# Patient Record
Sex: Male | Born: 1950 | Race: Black or African American | Hispanic: No | Smoking: Never smoker
Health system: Southern US, Community
[De-identification: ages and names within clinical notes are randomized; demographics above are authoritative.]

## PROBLEM LIST (undated history)

## (undated) DIAGNOSIS — B1920 Unspecified viral hepatitis C without hepatic coma: Secondary | ICD-10-CM

## (undated) DIAGNOSIS — M109 Gout, unspecified: Secondary | ICD-10-CM

## (undated) DIAGNOSIS — C61 Malignant neoplasm of prostate: Secondary | ICD-10-CM

## (undated) DIAGNOSIS — N183 Chronic kidney disease, stage 3 unspecified: Secondary | ICD-10-CM

## (undated) DIAGNOSIS — Z8719 Personal history of other diseases of the digestive system: Secondary | ICD-10-CM

## (undated) DIAGNOSIS — R351 Nocturia: Secondary | ICD-10-CM

## (undated) DIAGNOSIS — M1A09X1 Idiopathic chronic gout, multiple sites, with tophus (tophi): Secondary | ICD-10-CM

## (undated) DIAGNOSIS — M5137 Other intervertebral disc degeneration, lumbosacral region: Secondary | ICD-10-CM

## (undated) DIAGNOSIS — I10 Essential (primary) hypertension: Secondary | ICD-10-CM

## (undated) DIAGNOSIS — M1990 Unspecified osteoarthritis, unspecified site: Secondary | ICD-10-CM

## (undated) DIAGNOSIS — M48061 Spinal stenosis, lumbar region without neurogenic claudication: Secondary | ICD-10-CM

## (undated) DIAGNOSIS — N281 Cyst of kidney, acquired: Secondary | ICD-10-CM

## (undated) DIAGNOSIS — R399 Unspecified symptoms and signs involving the genitourinary system: Secondary | ICD-10-CM

## (undated) DIAGNOSIS — E785 Hyperlipidemia, unspecified: Secondary | ICD-10-CM

## (undated) DIAGNOSIS — M51379 Other intervertebral disc degeneration, lumbosacral region without mention of lumbar back pain or lower extremity pain: Secondary | ICD-10-CM

## (undated) HISTORY — PX: COLONOSCOPY WITH ESOPHAGOGASTRODUODENOSCOPY (EGD): SHX5779

## (undated) HISTORY — PX: HERNIA REPAIR: SHX51

## (undated) HISTORY — PX: PROSTATE BIOPSY: SHX241

---

## 1999-03-23 ENCOUNTER — Encounter: Payer: Self-pay | Admitting: Emergency Medicine

## 1999-03-23 ENCOUNTER — Emergency Department (HOSPITAL_COMMUNITY): Admission: EM | Admit: 1999-03-23 | Discharge: 1999-03-23 | Payer: Self-pay | Admitting: *Deleted

## 2001-03-09 ENCOUNTER — Emergency Department (HOSPITAL_COMMUNITY): Admission: EM | Admit: 2001-03-09 | Discharge: 2001-03-09 | Payer: Self-pay | Admitting: Emergency Medicine

## 2001-10-16 ENCOUNTER — Encounter (HOSPITAL_COMMUNITY): Admission: RE | Admit: 2001-10-16 | Discharge: 2002-01-14 | Payer: Self-pay | Admitting: *Deleted

## 2002-01-21 ENCOUNTER — Encounter (HOSPITAL_COMMUNITY): Admission: RE | Admit: 2002-01-21 | Discharge: 2002-04-21 | Payer: Self-pay | Admitting: *Deleted

## 2002-01-24 ENCOUNTER — Emergency Department (HOSPITAL_COMMUNITY): Admission: EM | Admit: 2002-01-24 | Discharge: 2002-01-24 | Payer: Self-pay

## 2003-02-22 ENCOUNTER — Emergency Department (HOSPITAL_COMMUNITY): Admission: EM | Admit: 2003-02-22 | Discharge: 2003-02-22 | Payer: Self-pay | Admitting: Emergency Medicine

## 2004-01-16 ENCOUNTER — Emergency Department (HOSPITAL_COMMUNITY): Admission: EM | Admit: 2004-01-16 | Discharge: 2004-01-16 | Payer: Self-pay | Admitting: Emergency Medicine

## 2004-06-11 ENCOUNTER — Emergency Department (HOSPITAL_COMMUNITY): Admission: EM | Admit: 2004-06-11 | Discharge: 2004-06-12 | Payer: Self-pay | Admitting: Emergency Medicine

## 2006-05-21 ENCOUNTER — Emergency Department (HOSPITAL_COMMUNITY): Admission: EM | Admit: 2006-05-21 | Discharge: 2006-05-21 | Payer: Self-pay | Admitting: Emergency Medicine

## 2006-07-19 ENCOUNTER — Emergency Department (HOSPITAL_COMMUNITY): Admission: EM | Admit: 2006-07-19 | Discharge: 2006-07-19 | Payer: Self-pay | Admitting: Emergency Medicine

## 2006-07-26 ENCOUNTER — Ambulatory Visit: Payer: Self-pay | Admitting: Family Medicine

## 2006-08-09 ENCOUNTER — Ambulatory Visit: Payer: Self-pay | Admitting: Family Medicine

## 2006-09-03 ENCOUNTER — Emergency Department (HOSPITAL_COMMUNITY): Admission: EM | Admit: 2006-09-03 | Discharge: 2006-09-03 | Payer: Self-pay | Admitting: Emergency Medicine

## 2006-09-06 ENCOUNTER — Ambulatory Visit: Payer: Self-pay | Admitting: Family Medicine

## 2006-09-11 ENCOUNTER — Ambulatory Visit: Payer: Self-pay | Admitting: Family Medicine

## 2006-09-29 ENCOUNTER — Ambulatory Visit: Payer: Self-pay | Admitting: Internal Medicine

## 2006-10-03 ENCOUNTER — Ambulatory Visit: Payer: Self-pay | Admitting: Family Medicine

## 2006-10-13 ENCOUNTER — Ambulatory Visit: Payer: Self-pay | Admitting: Internal Medicine

## 2006-11-03 ENCOUNTER — Ambulatory Visit: Payer: Self-pay | Admitting: Family Medicine

## 2007-06-08 ENCOUNTER — Emergency Department (HOSPITAL_COMMUNITY): Admission: EM | Admit: 2007-06-08 | Discharge: 2007-06-08 | Payer: Self-pay | Admitting: Emergency Medicine

## 2007-06-25 ENCOUNTER — Ambulatory Visit: Payer: Self-pay | Admitting: Family Medicine

## 2007-11-30 ENCOUNTER — Emergency Department (HOSPITAL_COMMUNITY): Admission: EM | Admit: 2007-11-30 | Discharge: 2007-11-30 | Payer: Self-pay | Admitting: Family Medicine

## 2008-05-10 ENCOUNTER — Emergency Department (HOSPITAL_COMMUNITY): Admission: EM | Admit: 2008-05-10 | Discharge: 2008-05-10 | Payer: Self-pay | Admitting: Emergency Medicine

## 2008-05-15 ENCOUNTER — Ambulatory Visit: Payer: Self-pay | Admitting: Family Medicine

## 2008-06-16 ENCOUNTER — Ambulatory Visit: Payer: Self-pay | Admitting: Family Medicine

## 2008-09-18 ENCOUNTER — Emergency Department (HOSPITAL_COMMUNITY): Admission: EM | Admit: 2008-09-18 | Discharge: 2008-09-18 | Payer: Self-pay | Admitting: Emergency Medicine

## 2009-04-07 ENCOUNTER — Emergency Department (HOSPITAL_COMMUNITY): Admission: EM | Admit: 2009-04-07 | Discharge: 2009-04-07 | Payer: Self-pay | Admitting: Family Medicine

## 2009-05-26 ENCOUNTER — Emergency Department (HOSPITAL_COMMUNITY): Admission: EM | Admit: 2009-05-26 | Discharge: 2009-05-26 | Payer: Self-pay | Admitting: Emergency Medicine

## 2009-09-20 ENCOUNTER — Emergency Department (HOSPITAL_COMMUNITY): Admission: EM | Admit: 2009-09-20 | Discharge: 2009-09-20 | Payer: Self-pay | Admitting: Emergency Medicine

## 2010-03-20 ENCOUNTER — Encounter: Payer: Self-pay | Admitting: *Deleted

## 2010-05-24 ENCOUNTER — Emergency Department (HOSPITAL_COMMUNITY)
Admission: EM | Admit: 2010-05-24 | Discharge: 2010-05-24 | Disposition: A | Discharge status: Home / Self Care | Payer: No Typology Code available for payment source | Attending: Emergency Medicine | Admitting: Emergency Medicine

## 2010-05-24 DIAGNOSIS — R21 Rash and other nonspecific skin eruption: Secondary | ICD-10-CM | POA: Insufficient documentation

## 2010-05-24 DIAGNOSIS — M109 Gout, unspecified: Secondary | ICD-10-CM | POA: Insufficient documentation

## 2010-05-24 DIAGNOSIS — M79609 Pain in unspecified limb: Secondary | ICD-10-CM | POA: Insufficient documentation

## 2010-05-24 DIAGNOSIS — L02619 Cutaneous abscess of unspecified foot: Secondary | ICD-10-CM | POA: Insufficient documentation

## 2010-05-24 DIAGNOSIS — I10 Essential (primary) hypertension: Secondary | ICD-10-CM | POA: Insufficient documentation

## 2010-05-24 LAB — CBC
HCT: 39.2 % (ref 39.0–52.0)
MCV: 94.8 fL (ref 78.0–100.0)
Platelets: 152 10*3/uL (ref 150–400)
Platelets: 155 10*3/uL (ref 150–400)
RDW: 12.9 % (ref 11.5–15.5)
WBC: 11.2 10*3/uL — ABNORMAL HIGH (ref 4.0–10.5)
WBC: 10.6 10*3/uL — ABNORMAL HIGH (ref 4.0–10.5)

## 2010-05-24 LAB — DIFFERENTIAL
Basophils Absolute: 0 10*3/uL (ref 0.0–0.1)
Basophils Relative: 0 % (ref 0–1)
Eosinophils Absolute: 0.1 10*3/uL (ref 0.0–0.7)
Eosinophils Relative: 1 % (ref 0–5)

## 2010-05-24 LAB — GRAM STAIN

## 2010-05-24 LAB — ANAEROBIC CULTURE

## 2010-05-24 LAB — BASIC METABOLIC PANEL
BUN: 15 mg/dL (ref 6–23)
GFR calc Af Amer: >60 mL/min (ref >60)
GFR calc non Af Amer: 55 mL/min — ABNORMAL LOW (ref >60)
Potassium: 4.5 mEq/L (ref 3.5–5.1)
Sodium: 140 mEq/L (ref 135–145)

## 2010-05-24 LAB — SYNOVIAL CELL COUNT + DIFF, W/ CRYSTALS
Crystals, Fluid: NONE SEEN
Monocyte-Macrophage-Synovial Fluid: 22 % — ABNORMAL LOW (ref 50–90)
WBC, Synovial: 35 /mm3 (ref 0–200)

## 2010-05-24 LAB — BODY FLUID CULTURE: Culture: NO GROWTH

## 2010-06-10 LAB — BODY FLUID CULTURE

## 2010-07-16 NOTE — Consult Note (Signed)
Derrick Lawson, Derrick Lawson              ACCOUNT NO.:  [id number]   MEDICAL RECORD NO.:  [id number]          PATIENT TYPE:  EMS   LOCATION:  [facility]                         FACILITY:  [facility]   PHYSICIAN:  Nadara Mustard, MD     DATE OF BIRTH:  August 11, 1950   DATE OF CONSULTATION:  06/12/2004  DATE OF DISCHARGE:                                   CONSULTATION   CONSULTING PHYSICIAN:  Nadara Mustard, MD   HISTORY OF PRESENT ILLNESS:  The patient is a 60 year old gentleman who  noticed acute redness and swelling of his right elbow today.  The patient  complains of pain, had no previous history of fever or illness.  Denies any  history of trauma.  The patient states he does have a history of gout.   PAST MEDICAL HISTORY:  Significant for hypertension.   He is a nonsmoker, does drink socially, does not use drugs.   The patient currently does not take any medications.   Has no known drug allergies.   No past medical history.   OBJECTIVE:  EXTREMITIES:  Examination of his right upper extremity, he has  no cervical or axillary nodes.  His elbow is warm and red.  There is no  olecranon bursitis.  He has pain with range of motion of the elbow.  There  is a mild effusion of the elbow.   ASSESSMENT:  Gout versus acute septic joint.   PLAN:  After informed consent and sterile prepping, the elbow was injected  from the lateral portal.  This was first injected with 5 cc of 1% lidocaine  plain and red clear synovial fluid was aspirated.  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   ASSESSMENT:  Acute gouty flare, right elbow.   PLAN:  1.  The patient received a Unasyn 3.1 grams in the emergency room and will      be discharged on colchicine 0.6 mg p.o. q.2h. p.r.n. for pain, as well      as a prescription for Augmentin 875 mg p.o. b.i.d., as well as Vicodin      one to      two p.o. q.6 h. p.r.n. for pain.  2.  Plan to follow up in the office in two days.  3.  He will follow up  sooner if he develops any increased redness or      swelling in the elbow.      MVD/MEDQ  D:  06/12/2004  T:  06/12/2004  Job:  [id number]

## 2010-08-13 ENCOUNTER — Inpatient Hospital Stay (INDEPENDENT_AMBULATORY_CARE_PROVIDER_SITE_OTHER)
Admission: RE | Admit: 2010-08-13 | Discharge: 2010-08-13 | Disposition: A | Discharge status: Home / Self Care | Payer: No Typology Code available for payment source | Source: Ambulatory Visit | Attending: Family Medicine | Admitting: Family Medicine

## 2010-08-13 ENCOUNTER — Emergency Department (HOSPITAL_COMMUNITY)
Admission: EM | Admit: 2010-08-13 | Discharge: 2010-08-13 | Disposition: A | Discharge status: Home / Self Care | Payer: No Typology Code available for payment source | Attending: Emergency Medicine | Admitting: Emergency Medicine

## 2010-08-13 DIAGNOSIS — I10 Essential (primary) hypertension: Secondary | ICD-10-CM

## 2010-08-13 DIAGNOSIS — A499 Bacterial infection, unspecified: Secondary | ICD-10-CM

## 2010-08-13 DIAGNOSIS — M0080 Arthritis due to other bacteria, unspecified joint: Secondary | ICD-10-CM

## 2010-08-13 DIAGNOSIS — M109 Gout, unspecified: Secondary | ICD-10-CM | POA: Insufficient documentation

## 2010-08-13 DIAGNOSIS — M25529 Pain in unspecified elbow: Secondary | ICD-10-CM | POA: Insufficient documentation

## 2010-08-13 DIAGNOSIS — M7989 Other specified soft tissue disorders: Secondary | ICD-10-CM | POA: Insufficient documentation

## 2010-08-13 DIAGNOSIS — N289 Disorder of kidney and ureter, unspecified: Secondary | ICD-10-CM | POA: Insufficient documentation

## 2010-08-13 LAB — DIFFERENTIAL
Basophils Absolute: 0 10*3/uL (ref 0.0–0.1)
Eosinophils Absolute: 0 10*3/uL (ref 0.0–0.7)
Eosinophils Relative: 0 % (ref 0–5)
Lymphs Abs: 1.9 10*3/uL (ref 0.7–4.0)
Monocytes Absolute: 1.3 10*3/uL — ABNORMAL HIGH (ref 0.1–1.0)

## 2010-08-13 LAB — CBC
MCHC: 34 g/dL (ref 30.0–36.0)
MCV: 91 fL (ref 78.0–100.0)
Platelets: 137 10*3/uL — ABNORMAL LOW (ref 150–400)
RDW: 13 % (ref 11.5–15.5)
WBC: 13 10*3/uL — ABNORMAL HIGH (ref 4.0–10.5)

## 2010-08-13 LAB — POCT URINALYSIS DIP (DEVICE)
Leukocytes, UA: NEGATIVE
Nitrite: NEGATIVE
Protein, ur: 30 mg/dL — AB
pH: 5.5 (ref 5.0–8.0)

## 2010-08-13 LAB — POCT I-STAT, CHEM 8
Creatinine, Ser: 1.4 mg/dL — ABNORMAL HIGH (ref 0.50–1.35)
Glucose, Bld: 92 mg/dL (ref 70–99)
Hemoglobin: 15.6 g/dL (ref 13.0–17.0)
TCO2: 26 mmol/L (ref 0–100)

## 2010-08-26 ENCOUNTER — Other Ambulatory Visit (HOSPITAL_COMMUNITY): Payer: Self-pay | Admitting: Pulmonary Disease

## 2010-08-26 DIAGNOSIS — I10 Essential (primary) hypertension: Secondary | ICD-10-CM

## 2010-08-31 ENCOUNTER — Ambulatory Visit (HOSPITAL_COMMUNITY)
Admission: RE | Admit: 2010-08-31 | Discharge: 2010-08-31 | Disposition: A | Discharge status: Home / Self Care | Payer: No Typology Code available for payment source | Source: Ambulatory Visit | Attending: Pulmonary Disease | Admitting: Pulmonary Disease

## 2010-08-31 DIAGNOSIS — Q619 Cystic kidney disease, unspecified: Secondary | ICD-10-CM | POA: Insufficient documentation

## 2010-08-31 DIAGNOSIS — I10 Essential (primary) hypertension: Secondary | ICD-10-CM | POA: Insufficient documentation

## 2010-08-31 MED ORDER — GADOFOSVESET TRISODIUM 244 MG/ML IV SOLN
10.0000 mL | Freq: Once | INTRAVENOUS | Status: AC
  Administered 2010-08-31: 10 mL via INTRAVENOUS

## 2010-11-23 LAB — POCT URINALYSIS DIP (DEVICE)
Glucose, UA: NEGATIVE
Hgb urine dipstick: NEGATIVE
Nitrite: NEGATIVE
Protein, ur: NEGATIVE
Urobilinogen, UA: 0.2

## 2010-12-07 ENCOUNTER — Inpatient Hospital Stay (INDEPENDENT_AMBULATORY_CARE_PROVIDER_SITE_OTHER)
Admission: RE | Admit: 2010-12-07 | Discharge: 2010-12-07 | Disposition: A | Discharge status: Home / Self Care | Payer: No Typology Code available for payment source | Source: Ambulatory Visit | Attending: Family Medicine | Admitting: Family Medicine

## 2010-12-07 ENCOUNTER — Ambulatory Visit (INDEPENDENT_AMBULATORY_CARE_PROVIDER_SITE_OTHER): Payer: No Typology Code available for payment source

## 2010-12-07 DIAGNOSIS — M109 Gout, unspecified: Secondary | ICD-10-CM

## 2011-06-21 ENCOUNTER — Emergency Department (HOSPITAL_COMMUNITY): Payer: No Typology Code available for payment source

## 2011-06-21 ENCOUNTER — Emergency Department (HOSPITAL_COMMUNITY)
Admission: EM | Admit: 2011-06-21 | Discharge: 2011-06-21 | Disposition: A | Discharge status: Home / Self Care | Payer: No Typology Code available for payment source | Attending: Emergency Medicine | Admitting: Emergency Medicine

## 2011-06-21 ENCOUNTER — Encounter (HOSPITAL_COMMUNITY): Payer: Self-pay

## 2011-06-21 DIAGNOSIS — M79609 Pain in unspecified limb: Secondary | ICD-10-CM | POA: Insufficient documentation

## 2011-06-21 DIAGNOSIS — I10 Essential (primary) hypertension: Secondary | ICD-10-CM | POA: Insufficient documentation

## 2011-06-21 DIAGNOSIS — M109 Gout, unspecified: Secondary | ICD-10-CM | POA: Insufficient documentation

## 2011-06-21 HISTORY — DX: Essential (primary) hypertension: I10

## 2011-06-21 MED ORDER — OXYCODONE-ACETAMINOPHEN 5-325 MG PO TABS: 1.0000 | ORAL_TABLET | Freq: Four times a day (QID) | ORAL | Status: AC | PRN

## 2011-06-21 MED ORDER — KETOROLAC TROMETHAMINE 60 MG/2ML IM SOLN
60.0000 mg | Freq: Once | INTRAMUSCULAR | Status: AC
  Administered 2011-06-21: 60 mg via INTRAMUSCULAR
  Filled 2011-06-21: qty 2

## 2011-06-21 MED ORDER — IBUPROFEN 800 MG PO TABS: 800.0000 mg | ORAL_TABLET | Freq: Three times a day (TID) | ORAL | Status: AC

## 2011-06-21 NOTE — Discharge Instructions (Signed)
Gout Gout is an inflammatory condition (arthritis) caused by a buildup of uric acid crystals in the joints. Uric acid is a chemical that is normally present in the blood. Under some circumstances, uric acid can form into crystals in your joints. This causes joint redness, soreness, and swelling (inflammation). Repeat attacks are common. Over time, uric acid crystals can form into masses (tophi) near a joint, causing disfigurement. Gout is treatable and often preventable. CAUSES  The disease begins with elevated levels of uric acid in the blood. Uric acid is produced by your body when it breaks down a naturally found substance called purines. This also happens when you eat certain foods such as meats and fish. Causes of an elevated uric acid level include:  Being passed down from parent to child (heredity).   Diseases that cause increased uric acid production (obesity, psoriasis, some cancers).   Excessive alcohol use.   Diet, especially diets rich in meat and seafood.   Medicines, including certain cancer-fighting drugs (chemotherapy), diuretics, and aspirin.   Chronic kidney disease. The kidneys are no longer able to remove uric acid well.   Problems with metabolism.  Conditions strongly associated with gout include:  Obesity.   High blood pressure.   High cholesterol.   Diabetes.  Not everyone with elevated uric acid levels gets gout. It is not understood why some people get gout and others do not. Surgery, joint injury, and eating too much of certain foods are some of the factors that can lead to gout. SYMPTOMS   An attack of gout comes on quickly. It causes intense pain with redness, swelling, and warmth in a joint.   Fever can occur.   Often, only one joint is involved. Certain joints are more commonly involved:   Base of the big toe.   Knee.   Ankle.   Wrist.   Finger.  Without treatment, an attack usually goes away in a few days to weeks. Between attacks, you  usually will not have symptoms, which is different from many other forms of arthritis. DIAGNOSIS  Your caregiver will suspect gout based on your symptoms and exam. Removal of fluid from the joint (arthrocentesis) is done to check for uric acid crystals. Your caregiver will give you a medicine that numbs the area (local anesthetic) and use a needle to remove joint fluid for exam. Gout is confirmed when uric acid crystals are seen in joint fluid, using a special microscope. Sometimes, blood, urine, and X-ray tests are also used. TREATMENT  There are 2 phases to gout treatment: treating the sudden onset (acute) attack and preventing attacks (prophylaxis). Treatment of an Acute Attack  Medicines are used. These include anti-inflammatory medicines or steroid medicines.   An injection of steroid medicine into the affected joint is sometimes necessary.   The painful joint is rested. Movement can worsen the arthritis.   You may use warm or cold treatments on painful joints, depending which works best for you.   Discuss the use of coffee, vitamin C, or cherries with your caregiver. These may be helpful treatment options.  Treatment to Prevent Attacks After the acute attack subsides, your caregiver may advise prophylactic medicine. These medicines either help your kidneys eliminate uric acid from your body or decrease your uric acid production. You may need to stay on these medicines for a very long time. The early phase of treatment with prophylactic medicine can be associated with an increase in acute gout attacks. For this reason, during the first few months   of treatment, your caregiver may also advise you to take medicines usually used for acute gout treatment. Be sure you understand your caregiver's directions. You should also discuss dietary treatment with your caregiver. Certain foods such as meats and fish can increase uric acid levels. Other foods such as dairy can decrease levels. Your caregiver  can give you a list of foods to avoid. HOME CARE INSTRUCTIONS   Do not take aspirin to relieve pain. This raises uric acid levels.   Only take over-the-counter or prescription medicines for pain, discomfort, or fever as directed by your caregiver.   Rest the joint as much as possible. When in bed, keep sheets and blankets off painful areas.   Keep the affected joint raised (elevated).   Use crutches if the painful joint is in your leg.   Drink enough water and fluids to keep your urine clear or pale yellow. This helps your body get rid of uric acid. Do not drink alcoholic beverages. They slow the passage of uric acid.   Follow your caregiver's dietary instructions. Pay careful attention to the amount of protein you eat. Your daily diet should emphasize fruits, vegetables, whole grains, and fat-free or low-fat milk products.   Maintain a healthy body weight.  SEEK MEDICAL CARE IF:   You have an oral temperature above 102 F (38.9 C).   You develop diarrhea, vomiting, or any side effects from medicines.   You do not feel better in 24 hours, or you are getting worse.  SEEK IMMEDIATE MEDICAL CARE IF:   Your joint becomes suddenly more tender and you have:   Chills.   An oral temperature above 102 F (38.9 C), not controlled by medicine.  MAKE SURE YOU:   Understand these instructions.   Will watch your condition.   Will get help right away if you are not doing well or get worse.  Document Released: 02/12/2000 Document Revised: 02/03/2011 Document Reviewed: 05/25/2009 [employer] Patient Information 2012 [employer]. 

## 2011-06-21 NOTE — ED Notes (Signed)
MD at bedside. 

## 2011-06-21 NOTE — ED Provider Notes (Signed)
History     CSN: [id number]  Arrival date & time 06/21/11  1191   First MD Initiated Contact with Patient 06/21/11 [phone]      Chief Complaint  Patient presents with  . Foot Pain    (Consider location/radiation/quality/duration/timing/severity/associated sxs/prior treatment) Patient is a 61 y.o. male presenting with lower extremity pain. The history is provided by the patient. No language interpreter was used.  Foot Pain This is a recurrent problem. The current episode started yesterday. The problem occurs constantly. The problem has not changed since onset.The symptoms are aggravated by walking. The symptoms are relieved by nothing. He has tried nothing for the symptoms. The treatment provided no relief.  No trauma.  No falls no f/c/r.  No rashes on the skin.  No weakness nor numbness.  No wounds.  Self diagnosed it as gout but is not sure and wants to make sure nothing else is wrong with the foot.    Past Medical History  Diagnosis Date  . Hypertension   . Gout     History reviewed. No pertinent past surgical history.  History reviewed. No pertinent family history.  History  Substance Use Topics  . Smoking status: Not on file  . Smokeless tobacco: Not on file  . Alcohol Use: No      Review of Systems  Musculoskeletal: Positive for arthralgias.  Skin: Negative for rash and wound.  All other systems reviewed and are negative.    Allergies  Review of patient's allergies indicates not on file.  Home Medications   Current Outpatient Rx  Name Route Sig Dispense Refill  . ALLOPURINOL 100 MG PO TABS Oral Take 100 mg by mouth daily.      BP 139/91  Pulse 61  Temp(Src) 98.1 F (36.7 C) (Oral)  Resp 18  Wt 180 lb (81.647 kg)  SpO2 100%  Physical Exam  Constitutional: He is oriented to person, place, and time. He appears well-developed and well-nourished.  HENT:  Head: Normocephalic and atraumatic.  Eyes: Conjunctivae are normal. Pupils are equal, round, and  reactive to light.  Cardiovascular: Normal rate and regular rhythm.   Pulmonary/Chest: Effort normal and breath sounds normal.  Abdominal: Soft. Bowel sounds are normal.  Musculoskeletal: Normal range of motion. He exhibits tenderness.       Right first MTP  Neurological: He is alert and oriented to person, place, and time.  Skin: Skin is warm and dry. No rash noted.  Psychiatric: He has a normal mood and affect.    ED Course  Procedures (including critical care time)  Labs Reviewed - No data to display No results found.   No diagnosis found.    MDM  Follow up with your family doctor for ongoing care        Cleaster Shiffer K Tzvi Economou-Rasch, MD 06/21/11 [phone]

## 2011-06-21 NOTE — ED Notes (Signed)
Pt complains of right foot pain and swelling since yesterday

## 2011-08-11 ENCOUNTER — Emergency Department (HOSPITAL_COMMUNITY)
Admission: EM | Admit: 2011-08-11 | Discharge: 2011-08-11 | Disposition: A | Discharge status: Home / Self Care | Payer: No Typology Code available for payment source | Source: Home / Self Care | Attending: Emergency Medicine | Admitting: Emergency Medicine

## 2011-08-11 ENCOUNTER — Encounter (HOSPITAL_COMMUNITY): Payer: Self-pay | Admitting: *Deleted

## 2011-08-11 DIAGNOSIS — M109 Gout, unspecified: Secondary | ICD-10-CM

## 2011-08-11 LAB — POCT I-STAT, CHEM 8
BUN: 29 mg/dL — ABNORMAL HIGH (ref 6–23)
Chloride: 106 mEq/L (ref 96–112)
Sodium: 143 mEq/L (ref 135–145)
TCO2: 25 mmol/L (ref 0–100)

## 2011-08-11 MED ORDER — PREDNISONE 20 MG PO TABS: ORAL_TABLET | ORAL | Status: AC

## 2011-08-11 MED ORDER — COLCHICINE 0.6 MG PO TABS: ORAL_TABLET | ORAL | Status: DC

## 2011-08-11 MED ORDER — DICLOFENAC SODIUM 1 % TD GEL: 1.0000 "application " | Freq: Four times a day (QID) | TRANSDERMAL | Status: DC

## 2011-08-11 MED ORDER — HYDROCODONE-ACETAMINOPHEN 5-325 MG PO TABS: 2.0000 | ORAL_TABLET | ORAL | Status: AC | PRN

## 2011-08-11 NOTE — ED Notes (Signed)
MD at bedside. 

## 2011-08-11 NOTE — ED Notes (Signed)
Pt reports possible gout in right foot with hx of same

## 2011-08-11 NOTE — ED Provider Notes (Signed)
History     CSN: [id number]  Arrival date & time 08/11/11  1136   First MD Initiated Contact with Patient 08/11/11 1232      Chief Complaint  Patient presents with  . Gout    (Consider location/radiation/quality/duration/timing/severity/associated sxs/prior treatment) HPI Comments: Patient reports atraumatic pain, swelling, erythema at his right first MTP starting 4 days ago. Denies any paresthesias, hyperesthesias preceding the swelling. No change in physical activity, or new shoes. No nausea, vomiting, fevers, paresthesias, weakness. He states this is identical to previous flares that have been diagnosed as gout. He is currently on  ARB/CCB/HCTZ combo, this is not new. He is taking allopurinol as written.  ROS as noted in HPI. All other ROS negative.   Patient is a 61 y.o. male presenting with lower extremity pain. The history is provided by the patient. No language interpreter was used.  Foot Pain This is a recurrent problem. The current episode started more than 2 days ago. The problem has not changed since onset.The symptoms are aggravated by walking. Nothing relieves the symptoms. He has tried acetaminophen for the symptoms. The treatment provided no relief.    Past Medical History  Diagnosis Date  . Hypertension   . Gout     History reviewed. No pertinent past surgical history.  Family History  Problem Relation Age of Onset  . Family history unknown: Yes    History  Substance Use Topics  . Smoking status: Never Smoker   . Smokeless tobacco: Not on file  . Alcohol Use: No      Review of Systems  Constitutional: Negative for fever.  Gastrointestinal: Negative for nausea.  Musculoskeletal: Positive for joint swelling and arthralgias.  Neurological: Negative for weakness and numbness.    Allergies  Review of patient's allergies indicates no known allergies.  Home Medications   Current Outpatient Rx  Name Route Sig Dispense Refill  . ACETAMINOPHEN 500 MG  PO TABS Oral Take 500 mg by mouth every 6 (six) hours as needed.    . ALLOPURINOL 300 MG PO TABS Oral Take 300 mg by mouth daily.    [name] OLMESARTAN-AMLODIPINE-HCTZ 40-10-25 MG PO TABS Oral Take 1 tablet by mouth daily.    . COLCHICINE 0.6 MG PO TABS  2 tabs po x 1, then one tab po 1 hour later 6 tablet 0  . DICLOFENAC SODIUM 1 % TD GEL Topical Apply 1 application topically 4 (four) times daily. 100 g 0  . HYDROCODONE-ACETAMINOPHEN 5-325 MG PO TABS Oral Take 2 tablets by mouth every 4 (four) hours as needed for pain. 20 tablet 0  . PREDNISONE 20 MG PO TABS  2 tabs po once daily x 3 days 6 tablet 0    BP 130/89  Pulse 68  Temp 98.2 F (36.8 C) (Oral)  Resp 18  SpO2 98%  Physical Exam  Nursing note and vitals reviewed. Constitutional: He is oriented to person, place, and time. He appears well-developed and well-nourished.  HENT:  Head: Normocephalic and atraumatic.  Eyes: Conjunctivae and EOM are normal.  Neck: Normal range of motion.  Cardiovascular: Normal rate.   Pulmonary/Chest: Effort normal. No respiratory distress.  Abdominal: He exhibits no distension.  Musculoskeletal: Normal range of motion.       Tenderness, erythema, mild swelling first MTP joint. Skin intact. Sensation grossly intact. DP 2+. No bony tenderness. No signs of infection or trauma. Wrist the foot, ankle within normal limits.  Neurological: He is alert and oriented to person, place, and time.  Skin: Skin is warm and dry.  Psychiatric: He has a normal mood and affect. His behavior is normal.    ED Course  Procedures (including critical care time)  Labs Reviewed  POCT I-STAT, CHEM 8 - Abnormal; Notable for the following:    BUN 29 (*)     Creatinine, Ser 1.50 (*)     All other components within normal limits   No results found.   1. Gout flare    Results for orders placed during the hospital encounter of 08/11/11  POCT I-STAT, CHEM 8      Component Value Range   Sodium 143  135 - 145 mEq/L    Potassium 4.5  3.5 - 5.1 mEq/L   Chloride 106  96 - 112 mEq/L   BUN 29 (*) 6 - 23 mg/dL   Creatinine, Ser 1.61 (*) 0.50 - 1.35 mg/dL   Glucose, Bld 99  70 - 99 mg/dL   Calcium, Ion 0.96  0.45 - 1.32 mmol/L   TCO2 25  0 - 100 mmol/L   Hemoglobin 14.3  13.0 - 17.0 g/dL   HCT 40.9  81.1 - 91.4 %     MDM  Previous labs reviewed. Has had elevated uric acid, 9.1 and 9.8 in 2012. Joint tap analysis 05/26/2009 showed no crystals, Gram stain, anaerobic culture negative. Was last seen in ED on 4/23 for tenderness at right first MTP. Sent home on allopurinol.   Patient has some renal insufficiency, suspect is from the diuretic, which is has caused this episode. No record of joint tap  positive for gout, but  he's had elevated uric acid in the past. We'll not repeat this.. Will send home with colchicine, prednisone, topical diclofenac. Will have him talk to his doctor about discontinuing the hydrochlorothiazide. Blood pressure is good today, so we'll not change his olmesartan/ amlodipine/ HCTZ combo. Discussed labs, MDM, and plan with patient. Discussed signs and symptoms that should prompt return to the department. Patient agrees with plan.  Luiz Blare, MD 08/11/11 1352

## 2011-08-11 NOTE — Discharge Instructions (Signed)
Medications such as the hydrochlorothiazide can contribute to gout flares. You will need to talk to your doctor about blood pressure medication management, and what to do when this happens. In meantime, continue her blood pressure medicine. It is under good control today. Keep your foot elevated, ice it for 20 minutes a time, and traced out for that. Take medication as written. Return for fever above 100.4, if you get worse, or other concerns.

## 2012-11-09 ENCOUNTER — Other Ambulatory Visit (HOSPITAL_COMMUNITY): Payer: Self-pay | Admitting: Urology

## 2012-11-09 DIAGNOSIS — R972 Elevated prostate specific antigen [PSA]: Secondary | ICD-10-CM

## 2012-11-22 ENCOUNTER — Ambulatory Visit (HOSPITAL_COMMUNITY)
Admission: RE | Admit: 2012-11-22 | Discharge: 2012-11-22 | Disposition: A | Discharge status: Home / Self Care | Payer: No Typology Code available for payment source | Source: Ambulatory Visit | Attending: Urology | Admitting: Urology

## 2012-11-22 DIAGNOSIS — R972 Elevated prostate specific antigen [PSA]: Secondary | ICD-10-CM

## 2012-11-22 DIAGNOSIS — N400 Enlarged prostate without lower urinary tract symptoms: Secondary | ICD-10-CM | POA: Insufficient documentation

## 2012-11-22 LAB — CREATININE, SERUM
Creatinine, Ser: 1.68 mg/dL — ABNORMAL HIGH (ref 0.50–1.35)
GFR calc non Af Amer: 43 mL/min — ABNORMAL LOW (ref >90)

## 2012-11-22 MED ORDER — GADOBENATE DIMEGLUMINE 529 MG/ML IV SOLN
17.0000 mL | Freq: Once | INTRAVENOUS | Status: AC | PRN
  Administered 2012-11-22: 17 mL via INTRAVENOUS

## 2013-05-28 ENCOUNTER — Other Ambulatory Visit (HOSPITAL_COMMUNITY): Payer: Self-pay | Admitting: Pulmonary Disease

## 2013-05-28 ENCOUNTER — Ambulatory Visit (HOSPITAL_COMMUNITY)
Admission: RE | Admit: 2013-05-28 | Discharge: 2013-05-28 | Disposition: A | Discharge status: Home / Self Care | Payer: 59 | Source: Ambulatory Visit | Attending: Pulmonary Disease | Admitting: Pulmonary Disease

## 2013-05-28 DIAGNOSIS — M25519 Pain in unspecified shoulder: Secondary | ICD-10-CM | POA: Insufficient documentation

## 2013-05-28 DIAGNOSIS — R52 Pain, unspecified: Secondary | ICD-10-CM

## 2013-06-12 ENCOUNTER — Encounter (HOSPITAL_COMMUNITY): Payer: Self-pay | Admitting: Emergency Medicine

## 2013-06-12 ENCOUNTER — Emergency Department (HOSPITAL_COMMUNITY)
Admission: EM | Admit: 2013-06-12 | Discharge: 2013-06-12 | Disposition: A | Discharge status: Home / Self Care | Payer: No Typology Code available for payment source | Attending: Emergency Medicine | Admitting: Emergency Medicine

## 2013-06-12 DIAGNOSIS — Z79899 Other long term (current) drug therapy: Secondary | ICD-10-CM | POA: Insufficient documentation

## 2013-06-12 DIAGNOSIS — I10 Essential (primary) hypertension: Secondary | ICD-10-CM | POA: Insufficient documentation

## 2013-06-12 DIAGNOSIS — Z791 Long term (current) use of non-steroidal anti-inflammatories (NSAID): Secondary | ICD-10-CM | POA: Insufficient documentation

## 2013-06-12 DIAGNOSIS — M109 Gout, unspecified: Secondary | ICD-10-CM | POA: Insufficient documentation

## 2013-06-12 MED ORDER — PREDNISONE 50 MG PO TABS: 50.0000 mg | ORAL_TABLET | Freq: Every day | ORAL | Status: DC

## 2013-06-12 MED ORDER — OXYCODONE-ACETAMINOPHEN 5-325 MG PO TABS
1.0000 | ORAL_TABLET | Freq: Once | ORAL | Status: AC
  Administered 2013-06-12: 1 via ORAL
  Filled 2013-06-12: qty 1

## 2013-06-12 MED ORDER — HYDROCODONE-ACETAMINOPHEN 5-325 MG PO TABS: 1.0000 | ORAL_TABLET | Freq: Four times a day (QID) | ORAL | Status: DC | PRN

## 2013-06-12 MED ORDER — PREDNISONE 20 MG PO TABS
60.0000 mg | ORAL_TABLET | Freq: Once | ORAL | Status: AC
  Administered 2013-06-12: 60 mg via ORAL
  Filled 2013-06-12: qty 3

## 2013-06-12 NOTE — Discharge Instructions (Signed)
Return here as needed.  Followup with your primary care Dr. for recheck °

## 2013-06-12 NOTE — ED Provider Notes (Signed)
CSN: [id number]     Arrival date & time 06/12/13  1813 History   First MD Initiated Contact with Patient 06/12/13 1826     Chief Complaint  Patient presents with  . Gout     (Consider location/radiation/quality/duration/timing/severity/associated sxs/prior Treatment) HPI Patient presents to the emergency department with a flare of his gout.  Patient, states, that the pain started last Friday.  Patient, states, that he is normally on allopurinol and colchicine.  He has not had a flare of his gout in quite a while.  Patient, states, that the pain is in his right great toe and extends to the mid foot area at times.  Patient denies fever, nausea, vomiting, headache, blurred vision, or numbness.  Patient, states he did not take any other medications prior to arrival   Past Medical History  Diagnosis Date  . Hypertension   . Gout    No past surgical history on file. No family history on file. History  Substance Use Topics  . Smoking status: Never Smoker   . Smokeless tobacco: Not on file  . Alcohol Use: No    Review of Systems All other systems negative except as documented in the HPI. All pertinent positives and negatives as reviewed in the HPI.   Allergies  Review of patient's allergies indicates no known allergies.  Home Medications   Prior to Admission medications   Medication Sig Start Date End Date Taking? Authorizing Provider  acetaminophen (TYLENOL) 500 MG tablet Take 500 mg by mouth every 6 (six) hours as needed.    Historical Provider, MD  allopurinol (ZYLOPRIM) 300 MG tablet Take 300 mg by mouth daily.    Historical Provider, MD  colchicine 0.6 MG tablet 2 tabs po x 1, then one tab po 1 hour later 08/11/11 08/10/12  Melynda Ripple, MD  diclofenac sodium (VOLTAREN) 1 % GEL Apply 1 application topically 4 (four) times daily. 08/11/11   Melynda Ripple, MD  Olmesartan-Amlodipine-HCTZ [facility]) 40-10-25 MG TABS Take 1 tablet by mouth daily.    Historical Provider, MD    BP 130/89  Pulse 65  Temp(Src) 98 F (36.7 C) (Oral)  Resp 16  SpO2 99% Physical Exam  Nursing note and vitals reviewed. Constitutional: He is oriented to person, place, and time. He appears well-developed and well-nourished. No distress.  HENT:  Head: Normocephalic and atraumatic.  Mouth/Throat: Oropharynx is clear and moist.  Eyes: Pupils are equal, round, and reactive to light.  Neck: Normal range of motion. Neck supple.  Cardiovascular: Normal rate, regular rhythm and normal heart sounds.  Exam reveals no gallop and no friction rub.   No murmur heard. Pulmonary/Chest: Effort normal and breath sounds normal. No respiratory distress.  Musculoskeletal:       Feet:  Neurological: He is alert and oriented to person, place, and time.  Skin: Skin is warm and dry.    ED Course  Procedures (including critical care time) Labs Review Labs Reviewed - No data to display  Patient has what appears to be a gout flare, based on the fact, that he's had similar episodes in the past and there is no signs of infection in the foot or toe.  Patient is advised to return here as needed.  Told him to followup with his primary care Dr. for recheck  Brent General, PA-C 06/12/13 1851

## 2013-06-12 NOTE — ED Notes (Signed)
He states his right big toe has been red and sore since last Fri.  He is in no distress.

## 2013-06-12 NOTE — ED Notes (Signed)
PT c/o gout flare in rt foot since last Friday.

## 2013-06-13 NOTE — ED Provider Notes (Signed)
Medical screening examination/treatment/procedure(s) were performed by non-physician practitioner and as supervising physician I was immediately available for consultation/collaboration.   EKG Interpretation None        Osvaldo Shipper, MD 06/13/13 [phone]

## 2013-06-14 ENCOUNTER — Telehealth (INDEPENDENT_AMBULATORY_CARE_PROVIDER_SITE_OTHER): Payer: Self-pay | Admitting: Surgery

## 2013-06-14 ENCOUNTER — Ambulatory Visit (INDEPENDENT_AMBULATORY_CARE_PROVIDER_SITE_OTHER): Payer: PRIVATE HEALTH INSURANCE | Admitting: Surgery

## 2013-06-14 ENCOUNTER — Encounter (INDEPENDENT_AMBULATORY_CARE_PROVIDER_SITE_OTHER): Payer: Self-pay | Admitting: Surgery

## 2013-06-14 VITALS — BP 120/80 | HR 67 | Temp 97.9°F | Resp 16 | Ht 69.0 in | Wt 170.6 lb

## 2013-06-14 DIAGNOSIS — K4090 Unilateral inguinal hernia, without obstruction or gangrene, not specified as recurrent: Secondary | ICD-10-CM

## 2013-06-14 NOTE — Progress Notes (Signed)
Patient ID: Derrick Lawson, male   DOB: 03/21/1950, 63 y.o.   MRN: [id number]  Chief Complaint  Patient presents with  . Umbilical Hernia    HPI Derrick Lawson is a 63 y.o. male.   HPI Patient sent at the request of Leola Brazil., MD due to a bulge in right groin region. This is noticed a couple of months ago. He has mild discomfort. The bulge slides slides out without difficulty. No nausea or vomiting. It appears to be getting larger.  Past Medical History  Diagnosis Date  . Hypertension   . Gout     History reviewed. No pertinent past surgical history.  History reviewed. No pertinent family history.  Social History History  Substance Use Topics  . Smoking status: Never Smoker   . Smokeless tobacco: Not on file  . Alcohol Use: No    No Known Allergies  Current Outpatient Prescriptions  Medication Sig Dispense Refill  . acetaminophen (TYLENOL) 500 MG tablet Take 500 mg by mouth every 6 (six) hours as needed.      . febuxostat (ULORIC) 40 MG tablet Take 40 mg by mouth daily.      [name] HYDROcodone-acetaminophen (NORCO/VICODIN) 5-325 MG per tablet Take 1 tablet by mouth every 6 (six) hours as needed for moderate pain.  20 tablet  0  . Olmesartan-Amlodipine-HCTZ (TRIBENZOR) 40-10-25 MG TABS Take 1 tablet by mouth daily.      . predniSONE (DELTASONE) 50 MG tablet Take 1 tablet (50 mg total) by mouth daily.  6 tablet  0  . colchicine 0.6 MG tablet 2 tabs po x 1, then one tab po 1 hour later  6 tablet  0   No current facility-administered medications for this visit.    Review of Systems Review of Systems  Constitutional: Negative for fever, chills and unexpected weight change.  HENT: Negative for congestion, hearing loss, sore throat, trouble swallowing and voice change.   Eyes: Negative for visual disturbance.  Respiratory: Negative for cough and wheezing.   Cardiovascular: Negative for chest pain, palpitations and leg swelling.  Gastrointestinal: Negative for  nausea, vomiting, abdominal pain, diarrhea, constipation, blood in stool, abdominal distention, anal bleeding and rectal pain.  Genitourinary: Negative for hematuria and difficulty urinating.  Musculoskeletal: Negative for arthralgias.  Skin: Negative for rash and wound.  Neurological: Negative for seizures, syncope, weakness and headaches.  Hematological: Negative for adenopathy. Does not bruise/bleed easily.  Psychiatric/Behavioral: Negative for confusion.    Blood pressure 120/80, pulse 67, temperature 97.9 F (36.6 C), resp. rate 16, height 5\' 9"  (1.753 m), weight 170 lb 9.6 oz (77.384 kg).  Physical Exam Physical Exam  Constitutional: He is oriented to person, place, and time. He appears well-developed and well-nourished.  HENT:  Head: Normocephalic and atraumatic.  Eyes: EOM are normal. Pupils are equal, round, and reactive to light.  Neck: Normal range of motion. Neck supple.  Cardiovascular: Normal rate and regular rhythm.   Pulmonary/Chest: Effort normal and breath sounds normal.  Abdominal: Soft. Bowel sounds are normal. A hernia is present. Hernia confirmed positive in the right inguinal area.    Musculoskeletal: Normal range of motion.  Neurological: He is alert and oriented to person, place, and time.  Skin: Skin is warm and dry.  Psychiatric: He has a normal mood and affect. His behavior is normal. Judgment and thought content normal.    Data Reviewed Office notes  Assessment    Right inguinal hernia reducible    Plan  Pt desires repair of right inguinal hernia.The risk of hernia repair include bleeding,  Infection,   Recurrence of the hernia,  Mesh use, chronic pain,  Organ injury,  Bowel injury,  Bladder injury,   nerve injury with numbness around the incision,  Death,  and worsening of preexisting  medical problems.  The alternatives to surgery have been discussed as well..  Long term expectations of both operative and non operative treatments have been  discussed.   The patient agrees to proceed.       Bailei Buist A. Gurshaan Matsuoka 06/14/2013, 10:35 AM

## 2013-06-14 NOTE — Patient Instructions (Signed)
Open Hernia Repair Open hernia repair is surgery to fix a hernia. A hernia occurs when an internal organ or tissue pushes out through a weak spot in the abdominal wall muscles. Hernias commonly occur in the groin and around the navel. Most hernias tend to get worse over time. Surgery is often done to prevent the hernia from getting bigger, becoming uncomfortable, or becoming an emergency. Emergency surgery may be needed if abdominal contents get stuck in the opening (incarcerated hernia) or the blood supply gets cut off (strangulated hernia). In an open repair, a large cut (incision) is made in the abdomen to perform the surgery. LET [facility] CARE PROVIDER KNOW ABOUT:  Any allergies you have.  All medicines you are taking, including vitamins, herbs, eye drops, creams, and over-the-counter medicines.  Previous problems you or members of your family have had with the use of anesthetics.  Any blood disorders you have.  Previous surgeries you have had.  Medical conditions you have. RISKS AND COMPLICATIONS Generally, this is a safe procedure. However, as with any procedure, complications can occur. Possible complications include:  Infection.  Bleeding.  Nerve injury.  Chronic pain.  The hernia can come back.  Injury to the intestines. BEFORE THE PROCEDURE  Ask your health care provider about changing or stopping any regular medicines. Avoid taking aspirin or blood thinners as directed by your health care provider.  Do noteat or drink anything after midnight the night before surgery.  If you smoke, do not smoke for at least 2 weeks before your surgery.  Do not drink alcohol the day before your surgery.  Let your health care provider know if you develop a cold or any infection before your surgery.  Arrange for someone to drive you home after the procedure or after your hospital stay. Also arrange for someone to help you with activities during recovery. PROCEDURE   Small  monitors will be put on your body. They are used to check your heart, blood pressure, and oxygen level.   An IV access tube will be put into one of your veins. Medicine will be able to flow directly into your body through this IV tube.   You might be given a medicine to help you relax (sedative).   You will be given a medicine to make you sleep (general anesthetic). A breathing tube may be placed into your lungs during the procedure.  A cut (incision) is made over the hernia defect, and the contents are pushed back into the abdomen.  If the hernia is small, stitches may be used to bring the muscle edges back together.  Typically, a surgeon will place a mesh patch made of man-made material (synthetic) to cover the defect. The mesh is sewn to healthy muscle. This reduces the risk of the hernia coming back.  The tissue and skin over the hernia are then closed with stitches or staples.  If the hernia was large, a drain may be left in place to collect excess fluid where the hernia used to be.  Bandages (dressings) are used to cover the incision. AFTER THE PROCEDURE  You will be taken to a recovery area where your progress will be monitored.  If the hernia was small or in the groin (inguinal) region, you will likely be allowed to go home once you are awake, stable, and taking fluids well.  If the hernia was large, you may have to wait for your bowel function to return. You may need to stay in the hospital  for 2 3 days until you can eat and your pain is controlled. A drain may be left in place for 5 7 days. You will be taught how to care for the drain. Document Released: 08/10/2000 Document Revised: 12/05/2012 Document Reviewed: 09/26/2012 [facility] Patient Information 2014 [place], [place].

## 2013-06-14 NOTE — Telephone Encounter (Signed)
06/14/13 I spoke with pt and went over benefits and financial responsibility. He will call back when he is ready to schedule his surgery/skm

## 2013-07-08 ENCOUNTER — Encounter (HOSPITAL_BASED_OUTPATIENT_CLINIC_OR_DEPARTMENT_OTHER): Payer: Self-pay | Admitting: *Deleted

## 2013-07-09 ENCOUNTER — Encounter (HOSPITAL_BASED_OUTPATIENT_CLINIC_OR_DEPARTMENT_OTHER)
Admission: RE | Admit: 2013-07-09 | Discharge: 2013-07-09 | Disposition: A | Discharge status: Home / Self Care | Payer: No Typology Code available for payment source | Source: Ambulatory Visit | Attending: Surgery | Admitting: Surgery

## 2013-07-09 ENCOUNTER — Other Ambulatory Visit: Payer: Self-pay

## 2013-07-09 LAB — COMPREHENSIVE METABOLIC PANEL
ALT: 31 U/L (ref 0–53)
AST: 37 U/L (ref 0–37)
Albumin: 4 g/dL (ref 3.5–5.2)
Alkaline Phosphatase: 82 U/L (ref 39–117)
BUN: 27 mg/dL — AB (ref 6–23)
CALCIUM: 9.4 mg/dL (ref 8.4–10.5)
CHLORIDE: 105 meq/L (ref 96–112)
CO2: 24 mEq/L (ref 19–32)
CREATININE: 1.48 mg/dL — AB (ref 0.50–1.35)
GFR calc Af Amer: 57 mL/min — ABNORMAL LOW (ref >90)
GFR calc non Af Amer: 49 mL/min — ABNORMAL LOW (ref >90)
GLUCOSE: 98 mg/dL (ref 70–99)
POTASSIUM: 4.8 meq/L (ref 3.7–5.3)
SODIUM: 142 meq/L (ref 137–147)
TOTAL PROTEIN: 7.9 g/dL (ref 6.0–8.3)
Total Bilirubin: 0.5 mg/dL (ref 0.3–1.2)

## 2013-07-09 LAB — CBC WITH DIFFERENTIAL/PLATELET
BASOS ABS: 0 10*3/uL (ref 0.0–0.1)
Basophils Relative: 0 % (ref 0–1)
EOS PCT: 2 % (ref 0–5)
Eosinophils Absolute: 0.1 10*3/uL (ref 0.0–0.7)
HCT: 37.5 % — ABNORMAL LOW (ref 39.0–52.0)
Hemoglobin: 12.5 g/dL — ABNORMAL LOW (ref 13.0–17.0)
Lymphocytes Relative: 26 % (ref 12–46)
Lymphs Abs: 1.7 10*3/uL (ref 0.7–4.0)
MCH: 31.2 pg (ref 26.0–34.0)
MCHC: 33.3 g/dL (ref 30.0–36.0)
MCV: 93.5 fL (ref 78.0–100.0)
Monocytes Absolute: 0.6 10*3/uL (ref 0.1–1.0)
Monocytes Relative: 9 % (ref 3–12)
NEUTROS ABS: 4.2 10*3/uL (ref 1.7–7.7)
Neutrophils Relative %: 63 % (ref 43–77)
PLATELETS: 175 10*3/uL (ref 150–400)
RBC: 4.01 MIL/uL — AB (ref 4.22–5.81)
RDW: 12.9 % (ref 11.5–15.5)
WBC: 6.6 10*3/uL (ref 4.0–10.5)

## 2013-07-11 ENCOUNTER — Encounter (HOSPITAL_BASED_OUTPATIENT_CLINIC_OR_DEPARTMENT_OTHER)
Admission: RE | Disposition: A | Discharge status: Home / Self Care | Payer: Self-pay | Source: Ambulatory Visit | Attending: Surgery

## 2013-07-11 ENCOUNTER — Ambulatory Visit (HOSPITAL_BASED_OUTPATIENT_CLINIC_OR_DEPARTMENT_OTHER)
Admission: RE | Admit: 2013-07-11 | Discharge: 2013-07-11 | Disposition: A | Discharge status: Home / Self Care | Payer: No Typology Code available for payment source | Source: Ambulatory Visit | Attending: Surgery | Admitting: Surgery

## 2013-07-11 ENCOUNTER — Encounter (HOSPITAL_BASED_OUTPATIENT_CLINIC_OR_DEPARTMENT_OTHER): Payer: No Typology Code available for payment source | Admitting: Anesthesiology

## 2013-07-11 ENCOUNTER — Encounter (HOSPITAL_BASED_OUTPATIENT_CLINIC_OR_DEPARTMENT_OTHER): Payer: Self-pay | Admitting: *Deleted

## 2013-07-11 ENCOUNTER — Ambulatory Visit (HOSPITAL_BASED_OUTPATIENT_CLINIC_OR_DEPARTMENT_OTHER): Payer: No Typology Code available for payment source | Admitting: Anesthesiology

## 2013-07-11 DIAGNOSIS — M109 Gout, unspecified: Secondary | ICD-10-CM | POA: Insufficient documentation

## 2013-07-11 DIAGNOSIS — I10 Essential (primary) hypertension: Secondary | ICD-10-CM | POA: Insufficient documentation

## 2013-07-11 DIAGNOSIS — K4090 Unilateral inguinal hernia, without obstruction or gangrene, not specified as recurrent: Secondary | ICD-10-CM | POA: Insufficient documentation

## 2013-07-11 HISTORY — PX: INGUINAL HERNIA REPAIR: SHX194

## 2013-07-11 HISTORY — PX: INSERTION OF MESH: SHX5868

## 2013-07-11 SURGERY — REPAIR, HERNIA, INGUINAL, ADULT
Anesthesia: General | Site: Groin | Laterality: Right

## 2013-07-11 MED ORDER — BUPIVACAINE-EPINEPHRINE (PF) 0.25% -1:200000 IJ SOLN
INTRAMUSCULAR | Status: AC
  Filled 2013-07-11: qty 30

## 2013-07-11 MED ORDER — FENTANYL CITRATE 0.05 MG/ML IJ SOLN
INTRAMUSCULAR | Status: AC
  Filled 2013-07-11: qty 4

## 2013-07-11 MED ORDER — MIDAZOLAM HCL 2 MG/2ML IJ SOLN
1.0000 mg | INTRAMUSCULAR | Status: DC | PRN
  Administered 2013-07-11: 2 mg via INTRAVENOUS

## 2013-07-11 MED ORDER — FENTANYL CITRATE 0.05 MG/ML IJ SOLN
50.0000 ug | INTRAMUSCULAR | Status: DC | PRN
  Administered 2013-07-11: 100 ug via INTRAVENOUS

## 2013-07-11 MED ORDER — DEXAMETHASONE SODIUM PHOSPHATE 4 MG/ML IJ SOLN
INTRAMUSCULAR | Status: DC | PRN
  Administered 2013-07-11: 10 mg via INTRAVENOUS

## 2013-07-11 MED ORDER — MIDAZOLAM HCL 2 MG/2ML IJ SOLN
INTRAMUSCULAR | Status: AC
  Filled 2013-07-11: qty 2

## 2013-07-11 MED ORDER — OXYCODONE HCL 5 MG PO TABS
ORAL_TABLET | ORAL | Status: AC
  Filled 2013-07-11: qty 1

## 2013-07-11 MED ORDER — PROPOFOL 10 MG/ML IV BOLUS
INTRAVENOUS | Status: DC | PRN
  Administered 2013-07-11: 50 mg via INTRAVENOUS
  Administered 2013-07-11: 200 mg via INTRAVENOUS
  Administered 2013-07-11: 50 mg via INTRAVENOUS

## 2013-07-11 MED ORDER — TRAMADOL HCL 50 MG PO TABS: 50.0000 mg | ORAL_TABLET | Freq: Four times a day (QID) | ORAL | Status: DC | PRN

## 2013-07-11 MED ORDER — BUPIVACAINE-EPINEPHRINE (PF) 0.5% -1:200000 IJ SOLN
INTRAMUSCULAR | Status: DC | PRN
  Administered 2013-07-11: 23 mL

## 2013-07-11 MED ORDER — CHLORHEXIDINE GLUCONATE 4 % EX LIQD: 1.0000 "application " | Freq: Once | CUTANEOUS | Status: DC

## 2013-07-11 MED ORDER — HYDROMORPHONE HCL PF 1 MG/ML IJ SOLN
INTRAMUSCULAR | Status: AC
  Filled 2013-07-11: qty 1

## 2013-07-11 MED ORDER — ONDANSETRON HCL 4 MG/2ML IJ SOLN: 4.0000 mg | Freq: Once | INTRAMUSCULAR | Status: DC | PRN

## 2013-07-11 MED ORDER — BUPIVACAINE-EPINEPHRINE 0.25% -1:200000 IJ SOLN
INTRAMUSCULAR | Status: DC | PRN
  Administered 2013-07-11: 4 mL

## 2013-07-11 MED ORDER — CEFAZOLIN SODIUM-DEXTROSE 2-3 GM-% IV SOLR
INTRAVENOUS | Status: AC
  Filled 2013-07-11: qty 50

## 2013-07-11 MED ORDER — FENTANYL CITRATE 0.05 MG/ML IJ SOLN
INTRAMUSCULAR | Status: AC
  Filled 2013-07-11: qty 2

## 2013-07-11 MED ORDER — OXYCODONE HCL 5 MG/5ML PO SOLN: 5.0000 mg | Freq: Once | ORAL | Status: AC | PRN

## 2013-07-11 MED ORDER — OXYCODONE-ACETAMINOPHEN 5-325 MG PO TABS: 1.0000 | ORAL_TABLET | ORAL | Status: DC | PRN

## 2013-07-11 MED ORDER — SUCCINYLCHOLINE CHLORIDE 20 MG/ML IJ SOLN
INTRAMUSCULAR | Status: DC | PRN
  Administered 2013-07-11: 100 mg via INTRAVENOUS

## 2013-07-11 MED ORDER — EPHEDRINE SULFATE 50 MG/ML IJ SOLN
INTRAMUSCULAR | Status: DC | PRN
  Administered 2013-07-11 (×3): 10 mg via INTRAVENOUS

## 2013-07-11 MED ORDER — HYDROMORPHONE HCL PF 1 MG/ML IJ SOLN
0.2500 mg | INTRAMUSCULAR | Status: DC | PRN
  Administered 2013-07-11 (×3): 0.5 mg via INTRAVENOUS

## 2013-07-11 MED ORDER — CEFAZOLIN SODIUM-DEXTROSE 2-3 GM-% IV SOLR
2.0000 g | INTRAVENOUS | Status: AC
Start: 2013-07-11 — End: 2013-07-11
  Administered 2013-07-11: 2 g via INTRAVENOUS

## 2013-07-11 MED ORDER — LACTATED RINGERS IV SOLN
INTRAVENOUS | Status: DC
  Administered 2013-07-11: 12:00:00 via INTRAVENOUS

## 2013-07-11 MED ORDER — ONDANSETRON HCL 4 MG/2ML IJ SOLN
INTRAMUSCULAR | Status: DC | PRN
  Administered 2013-07-11: 4 mg via INTRAVENOUS

## 2013-07-11 MED ORDER — FENTANYL CITRATE 0.05 MG/ML IJ SOLN
INTRAMUSCULAR | Status: DC | PRN
Start: 2013-07-11 — End: 2013-07-11
  Administered 2013-07-11 (×2): 25 ug via INTRAVENOUS
  Administered 2013-07-11: 50 ug via INTRAVENOUS

## 2013-07-11 MED ORDER — LIDOCAINE HCL (CARDIAC) 20 MG/ML IV SOLN
INTRAVENOUS | Status: DC | PRN
  Administered 2013-07-11: 70 mg via INTRAVENOUS

## 2013-07-11 MED ORDER — OXYCODONE HCL 5 MG PO TABS
5.0000 mg | ORAL_TABLET | Freq: Once | ORAL | Status: AC | PRN
  Administered 2013-07-11: 5 mg via ORAL

## 2013-07-11 SURGICAL SUPPLY — 55 items
ADH SKN CLS APL DERMABOND .7 (GAUZE/BANDAGES/DRESSINGS) ×1
BLADE 15 SAFETY STRL DISP (BLADE) ×1 IMPLANT
BLADE SURG 15 STRL LF DISP TIS (BLADE) IMPLANT
BLADE SURG 15 STRL SS (BLADE) ×3
BLADE SURG ROTATE 9660 (MISCELLANEOUS) ×2 IMPLANT
CANISTER SUCT 1200ML W/VALVE (MISCELLANEOUS) ×3 IMPLANT
CHLORAPREP W/TINT 26ML (MISCELLANEOUS) ×3 IMPLANT
COVER MAYO STAND STRL (DRAPES) ×3 IMPLANT
COVER TABLE BACK 60X90 (DRAPES) ×3 IMPLANT
DECANTER SPIKE VIAL GLASS SM (MISCELLANEOUS) ×1 IMPLANT
DERMABOND ADVANCED (GAUZE/BANDAGES/DRESSINGS) ×2
DERMABOND ADVANCED .7 DNX12 (GAUZE/BANDAGES/DRESSINGS) ×1 IMPLANT
DRAIN PENROSE 1/2X12 LTX STRL (WOUND CARE) ×3 IMPLANT
DRAPE LAPAROTOMY TRNSV 102X78 (DRAPE) ×3 IMPLANT
DRAPE UTILITY XL STRL (DRAPES) ×3 IMPLANT
ELECT COATED BLADE 2.86 ST (ELECTRODE) ×3 IMPLANT
ELECT REM PT RETURN 9FT ADLT (ELECTROSURGICAL) ×3
ELECTRODE REM PT RTRN 9FT ADLT (ELECTROSURGICAL) ×1 IMPLANT
GAUZE SPONGE 4X4 16PLY XRAY LF (GAUZE/BANDAGES/DRESSINGS) IMPLANT
GLOVE BIO SURGEON STRL SZ 6.5 (GLOVE) ×1 IMPLANT
GLOVE BIO SURGEONS STRL SZ 6.5 (GLOVE) ×1
GLOVE BIOGEL PI IND STRL 7.0 (GLOVE) IMPLANT
GLOVE BIOGEL PI IND STRL 8 (GLOVE) ×1 IMPLANT
GLOVE BIOGEL PI INDICATOR 7.0 (GLOVE) ×2
GLOVE BIOGEL PI INDICATOR 8 (GLOVE) ×2
GLOVE ECLIPSE 8.0 STRL XLNG CF (GLOVE) ×3 IMPLANT
GOWN STRL REUS W/ TWL LRG LVL3 (GOWN DISPOSABLE) ×2 IMPLANT
GOWN STRL REUS W/TWL LRG LVL3 (GOWN DISPOSABLE) ×6
MESH HERNIA SYS ULTRAPRO LRG (Mesh General) ×2 IMPLANT
NDL HYPO 25X1 1.5 SAFETY (NEEDLE) ×1 IMPLANT
NEEDLE HYPO 25X1 1.5 SAFETY (NEEDLE) ×3 IMPLANT
NS IRRIG 1000ML POUR BTL (IV SOLUTION) ×2 IMPLANT
PACK BASIN DAY SURGERY FS (CUSTOM PROCEDURE TRAY) ×3 IMPLANT
PENCIL BUTTON HOLSTER BLD 10FT (ELECTRODE) ×3 IMPLANT
SLEEVE SCD COMPRESS KNEE MED (MISCELLANEOUS) ×3 IMPLANT
SPONGE GAUZE 4X4 12PLY STER LF (GAUZE/BANDAGES/DRESSINGS) IMPLANT
SPONGE LAP 4X18 X RAY DECT (DISPOSABLE) ×3 IMPLANT
STAPLER VISISTAT 35W (STAPLE) IMPLANT
SUT MON AB 4-0 PC3 18 (SUTURE) ×3 IMPLANT
SUT NOVA 0 T19/GS 22DT (SUTURE) ×6 IMPLANT
SUT VIC AB 0 SH 27 (SUTURE) ×3 IMPLANT
SUT VIC AB 2-0 SH 27 (SUTURE) ×3
SUT VIC AB 2-0 SH 27XBRD (SUTURE) ×1 IMPLANT
SUT VIC AB 3-0 54X BRD REEL (SUTURE) IMPLANT
SUT VIC AB 3-0 BRD 54 (SUTURE)
SUT VICRYL 3-0 CR8 SH (SUTURE) ×3 IMPLANT
SUT VICRYL AB 2 0 TIE (SUTURE) IMPLANT
SUT VICRYL AB 2 0 TIES (SUTURE)
SYR CONTROL 10ML LL (SYRINGE) ×3 IMPLANT
TAPE HYPAFIX 4 X10 (GAUZE/BANDAGES/DRESSINGS) IMPLANT
TOWEL OR 17X24 6PK STRL BLUE (TOWEL DISPOSABLE) ×4 IMPLANT
TOWEL OR NON WOVEN STRL DISP B (DISPOSABLE) ×1 IMPLANT
TUBE CONNECTING 20'X1/4 (TUBING) ×1
TUBE CONNECTING 20X1/4 (TUBING) ×2 IMPLANT
YANKAUER SUCT BULB TIP NO VENT (SUCTIONS) ×3 IMPLANT

## 2013-07-11 NOTE — Interval H&P Note (Signed)
History and Physical Interval Note:  07/11/2013 1:06 PM  Derrick Lawson  has presented today for surgery, with the diagnosis of right inguinal hernia  The various methods of treatment have been discussed with the patient and family. After consideration of risks, benefits and other options for treatment, the patient has consented to  Procedure(s): RIGHT HERNIA REPAIR INGUINAL ADULT (Right) INSERTION OF MESH (N/A) as a surgical intervention .  The patient's history has been reviewed, patient examined, no change in status, stable for surgery.  I have reviewed the patient's chart and labs.  Questions were answered to the patient's satisfaction.     Adaya Garmany A. Zakee Deerman

## 2013-07-11 NOTE — Discharge Instructions (Signed)
[facility] _______[facility] ° °UMBILICAL OR INGUINAL HERNIA REPAIR: POST OP INSTRUCTIONS ° °Always review your discharge instruction sheet given to you by the facility where your surgery was performed. °IF YOU HAVE DISABILITY OR FAMILY LEAVE FORMS, YOU MUST BRING THEM TO THE OFFICE FOR PROCESSING.   °DO NOT GIVE THEM TO YOUR DOCTOR. ° °1. A  prescription for pain medication may be given to you upon discharge.  Take your pain medication as prescribed, if needed.  If narcotic pain medicine is not needed, then you may take acetaminophen (Tylenol) or ibuprofen (Advil) as needed. °2. Take your usually prescribed medications unless otherwise directed. °3. If you need a refill on your pain medication, please contact your pharmacy.  They will contact our office to request authorization. Prescriptions will not be filled after 5 pm or on week-ends. °4. You should follow a light diet the first 24 hours after arrival home, such as soup and crackers, etc.  Be sure to include lots of fluids daily.  Resume your normal diet the day after surgery. °5. Most patients will experience some swelling and bruising around the umbilicus or in the groin and scrotum.  Ice packs and reclining will help.  Swelling and bruising can take several days to resolve.  °6. It is common to experience some constipation if taking pain medication after surgery.  Increasing fluid intake and taking a stool softener (such as Colace) will usually help or prevent this problem from occurring.  A mild laxative (Milk of Magnesia or Miralax) should be taken according to package directions if there are no bowel movements after 48 hours. °7. Unless discharge instructions indicate otherwise, you may remove your bandages 24-48 hours after surgery, and you may shower at that time.  You may have steri-strips (small skin tapes) in place directly over the incision.  These strips should be left on the skin for 7-10 days.  If your surgeon used skin glue on the  incision, you may shower in 24 hours.  The glue will flake off over the next 2-3 weeks.  Any sutures or staples will be removed at the office during your follow-up visit. °8. ACTIVITIES:  You may resume regular (light) daily activities beginning the next day--such as daily self-care, walking, climbing stairs--gradually increasing activities as tolerated.  You may have sexual intercourse when it is comfortable.  Refrain from any heavy lifting or straining until approved by your doctor. °a. You may drive when you are no longer taking prescription pain medication, you can comfortably wear a seatbelt, and you can safely maneuver your car and apply brakes. °b. RETURN TO WORK:  __________________________________________________________ °9. You should see your doctor in the office for a follow-up appointment approximately 2-3 weeks after your surgery.  Make sure that you call for this appointment within a day or two after you arrive home to insure a convenient appointment time. °10. OTHER INSTRUCTIONS:  __________________________________________________________________________________________________________________________________________________________________________________________  °WHEN TO CALL YOUR DOCTOR: °1. Fever over 101.0 °2. Inability to urinate °3. Nausea and/or vomiting °4. Extreme swelling or bruising °5. Continued bleeding from incision. °6. Increased pain, redness, or drainage from the incision ° °The clinic staff is available to answer your questions during regular business hours.  Please don’t hesitate to call and ask to speak to one of the nurses for clinical concerns.  If you have a medical emergency, go to the nearest emergency room or call 911.  A surgeon from [facility] is always on call at the hospital ° ° °  [address], [address], [place], [place]  [address] ? ° [address] [address], [place], [place]   [address] °[phone] ? [phone] ? FAX [phone] °Web site:  [email or web address] ° ° ° °Post Anesthesia Home Care Instructions ° °Activity: °Get plenty of rest for the remainder of the day. A responsible adult should stay with you for 24 hours following the procedure.  °For the next 24 hours, DO NOT: °-Drive a car °-Operate machinery °-Drink alcoholic beverages °-Take any medication unless instructed by your physician °-Make any legal decisions or sign important papers. ° °Meals: °Start with liquid foods such as gelatin or soup. Progress to regular foods as tolerated. Avoid greasy, spicy, heavy foods. If nausea and/or vomiting occur, drink only clear liquids until the nausea and/or vomiting subsides. Call your physician if vomiting continues. ° °Special Instructions/Symptoms: °Your throat may feel dry or sore from the anesthesia or the breathing tube placed in your throat during surgery. If this causes discomfort, gargle with warm salt water. The discomfort should disappear within 24 hours. ° °

## 2013-07-11 NOTE — Anesthesia Preprocedure Evaluation (Signed)
Anesthesia Evaluation  Patient identified by MRN, date of birth, ID band Patient awake    Reviewed: Allergy & Precautions, H&P , NPO status , Patient's Chart, lab work & pertinent test results  Airway Mallampati: I TM Distance: >3 FB Neck ROM: Full    Dental  (+) Teeth Intact, Dental Advisory Given   Pulmonary  breath sounds clear to auscultation        Cardiovascular hypertension, Pt. on medications Rhythm:Regular Rate:Normal     Neuro/Psych    GI/Hepatic   Endo/Other    Renal/GU      Musculoskeletal   Abdominal   Peds  Hematology   Anesthesia Other Findings   Reproductive/Obstetrics                           Anesthesia Physical Anesthesia Plan  ASA: I  Anesthesia Plan: General   Post-op Pain Management:    Induction: Intravenous  Airway Management Planned: LMA  Additional Equipment:   Intra-op Plan:   Post-operative Plan: Extubation in OR  Informed Consent: I have reviewed the patients History and Physical, chart, labs and discussed the procedure including the risks, benefits and alternatives for the proposed anesthesia with the patient or authorized representative who has indicated his/her understanding and acceptance.   Dental advisory given  Plan Discussed with: CRNA, Anesthesiologist and Surgeon  Anesthesia Plan Comments:         Anesthesia Quick Evaluation

## 2013-07-11 NOTE — Op Note (Signed)
Right inguinal Hernia, Open, Procedure Note with Ultrapro mesh  Indications: The patient presented with a history of a right, reducible inguinal  hernia.  Open and laparoscopic repair discussed.  The risk of hernia repair include bleeding,  Infection,   Recurrence of the hernia,  Mesh use, chronic pain,  Organ injury,  Bowel injury,  Bladder injury,   nerve injury with numbness around the incision,  Death,  and worsening of preexisting  medical problems.  The alternatives to surgery have been discussed as well..  Long term expectations of both operative and non operative treatments have been discussed.   The patient agrees to proceed.  Pre-operative Diagnosis: right reducible inguinal hernia  Post-operative Diagnosis: same  Surgeon: Joyice Faster. Darilyn Storbeck   Assistants: none  Anesthesia: General endotracheal anesthesia and TEP block per anesthsiology  ASA Class: 2  Procedure Details  The patient was seen again in the Holding Room. The risks, benefits, complications, treatment options, and expected outcomes were discussed with the patient. The possibilities of reaction to medication, pulmonary aspiration, perforation of viscus, bleeding, recurrent infection, the need for additional procedures, and development of a complication requiring transfusion or further operation were discussed with the patient and/or family. There was concurrence with the proposed plan, and informed consent was obtained. The site of surgery was properly noted/marked. The patient was taken to the Operating Room, identified as Derrick Lawson, and the procedure verified as hernia repair. A Time Out was held and the above information confirmed.  The patient was placed in the supine position and underwent induction of anesthesia, the lower abdomen and groin was prepped and draped in the standard fashion, and 0.25% Marcaine with epinephrine was used to anesthetize the skin over the mid-portion of the inguinal canal. A transverse  incision was made. Dissection was carried through the soft tissue to expose the inguinal canal and inguinal ligament along its lower edge. The external oblique fascia was split along the course of its fibers, exposing the inguinal canal. The cord and nerve were looped using a Penrose drain and reflected out of the field. The defect was exposed and a piece of prolene hernia system ultrapro mesh was and placed into  the indirect defect. Lipoma reduced as well.  Interupted 1-0 novafil suture was then used  to repair the defect, with the suture being sewn from the pubic tubercle inferiorly and superiorly along the canal to a level just beyond the internal ring. The mesh was split to allow passage of the cord  into the canal without entrapment.  Ilioinguinal  Nerve.  The contents were then returned to canal and the external oblique fashion was then closed in a continuous fashion using 2-0 Vicryl suture taking care not to cause entrapment. Scarpa's layer closed with 3 0 vicryl and 4 0 monocryl used to close the skin.  Dermabond used for dressing.  Instrument, sponge, and needle counts were correct prior to closure and at the conclusion of the case.  Findings: Hernia as above  Estimated Blood Loss: less than 50 mL         Drains: None         Total IV Fluids: 800 mL         Specimens: none               Complications: None; patient tolerated the procedure well.         Disposition: PACU - hemodynamically stable.         Condition: stable

## 2013-07-11 NOTE — Anesthesia Procedure Notes (Addendum)
Anesthesia Regional Block:  TAP block  Pre-Anesthetic Checklist: ,, timeout performed, Correct Patient, Correct Site, Correct Laterality, Correct Procedure, Correct Position, site marked, Risks and benefits discussed,  Surgical consent,  Pre-op evaluation,  At surgeon's request and post-op pain management  Laterality: Right and Lower  Prep: chloraprep       Needles:  Injection technique: Single-shot  Needle Type: Echogenic Needle     Needle Length: 9cm 9 cm Needle Gauge: 21 and 21 G    Additional Needles:  Procedures: ultrasound guided (picture in chart) TAP block Narrative:  Start time: 07/11/2013 12:32 PM End time: 07/11/2013 12:39 PM Injection made incrementally with aspirations every 5 mL.  Performed by: Personally  Anesthesiologist: Lorrene Reid, MD   Procedure Name: Intubation Performed by: Terrance Mass Pre-anesthesia Checklist: Patient identified, Timeout performed, Emergency Drugs available, Suction available and Patient being monitored Patient Re-evaluated:Patient Re-evaluated prior to inductionOxygen Delivery Method: Circle system utilized Preoxygenation: Pre-oxygenation with 100% oxygen Intubation Type: IV induction Ventilation: Mask ventilation without difficulty Laryngoscope Size: Miller and 2 Grade View: Grade I Tube type: Oral Tube size: 7.0 mm Number of attempts: 1 Airway Equipment and Method: Stylet Placement Confirmation: breath sounds checked- equal and bilateral,  positive ETCO2 and ETT inserted through vocal cords under direct vision Secured at: 22 cm Tube secured with: Tape Dental Injury: Teeth and Oropharynx as per pre-operative assessment

## 2013-07-11 NOTE — Transfer of Care (Signed)
Immediate Anesthesia Transfer of Care Note  Patient: Derrick Lawson  Procedure(s) Performed: Procedure(s): RIGHT HERNIA REPAIR INGUINAL ADULT (Right) INSERTION OF MESH (Right)  Patient Location: PACU  Anesthesia Type:GA combined with regional for post-op pain  Level of Consciousness: awake and patient cooperative  Airway & Oxygen Therapy: Patient Spontanous Breathing and Patient connected to face mask oxygen  Post-op Assessment: Report given to PACU RN and Post -op Vital signs reviewed and stable  Post vital signs: Reviewed and stable  Complications: No apparent anesthesia complications

## 2013-07-11 NOTE — H&P (View-Only) (Signed)
Patient ID: Derrick Lawson, male   DOB: 03/14/1950, 63 y.o.   MRN: [id number]  Chief Complaint  Patient presents with  . Umbilical Hernia    HPI Derrick Lawson is a 63 y.o. male.   HPI Patient sent at the request of Leola Brazil., MD due to a bulge in right groin region. This is noticed a couple of months ago. He has mild discomfort. The bulge slides slides out without difficulty. No nausea or vomiting. It appears to be getting larger.  Past Medical History  Diagnosis Date  . Hypertension   . Gout     History reviewed. No pertinent past surgical history.  History reviewed. No pertinent family history.  Social History History  Substance Use Topics  . Smoking status: Never Smoker   . Smokeless tobacco: Not on file  . Alcohol Use: No    No Known Allergies  Current Outpatient Prescriptions  Medication Sig Dispense Refill  . acetaminophen (TYLENOL) 500 MG tablet Take 500 mg by mouth every 6 (six) hours as needed.      . febuxostat (ULORIC) 40 MG tablet Take 40 mg by mouth daily.      [name] HYDROcodone-acetaminophen (NORCO/VICODIN) 5-325 MG per tablet Take 1 tablet by mouth every 6 (six) hours as needed for moderate pain.  20 tablet  0  . Olmesartan-Amlodipine-HCTZ (TRIBENZOR) 40-10-25 MG TABS Take 1 tablet by mouth daily.      . predniSONE (DELTASONE) 50 MG tablet Take 1 tablet (50 mg total) by mouth daily.  6 tablet  0  . colchicine 0.6 MG tablet 2 tabs po x 1, then one tab po 1 hour later  6 tablet  0   No current facility-administered medications for this visit.    Review of Systems Review of Systems  Constitutional: Negative for fever, chills and unexpected weight change.  HENT: Negative for congestion, hearing loss, sore throat, trouble swallowing and voice change.   Eyes: Negative for visual disturbance.  Respiratory: Negative for cough and wheezing.   Cardiovascular: Negative for chest pain, palpitations and leg swelling.  Gastrointestinal: Negative for  nausea, vomiting, abdominal pain, diarrhea, constipation, blood in stool, abdominal distention, anal bleeding and rectal pain.  Genitourinary: Negative for hematuria and difficulty urinating.  Musculoskeletal: Negative for arthralgias.  Skin: Negative for rash and wound.  Neurological: Negative for seizures, syncope, weakness and headaches.  Hematological: Negative for adenopathy. Does not bruise/bleed easily.  Psychiatric/Behavioral: Negative for confusion.    Blood pressure 120/80, pulse 67, temperature 97.9 F (36.6 C), resp. rate 16, height 5\' 9"  (1.753 m), weight 170 lb 9.6 oz (77.384 kg).  Physical Exam Physical Exam  Constitutional: He is oriented to person, place, and time. He appears well-developed and well-nourished.  HENT:  Head: Normocephalic and atraumatic.  Eyes: EOM are normal. Pupils are equal, round, and reactive to light.  Neck: Normal range of motion. Neck supple.  Cardiovascular: Normal rate and regular rhythm.   Pulmonary/Chest: Effort normal and breath sounds normal.  Abdominal: Soft. Bowel sounds are normal. A hernia is present. Hernia confirmed positive in the right inguinal area.    Musculoskeletal: Normal range of motion.  Neurological: He is alert and oriented to person, place, and time.  Skin: Skin is warm and dry.  Psychiatric: He has a normal mood and affect. His behavior is normal. Judgment and thought content normal.    Data Reviewed Office notes  Assessment    Right inguinal hernia reducible    Plan  Pt desires repair of right inguinal hernia.The risk of hernia repair include bleeding,  Infection,   Recurrence of the hernia,  Mesh use, chronic pain,  Organ injury,  Bowel injury,  Bladder injury,   nerve injury with numbness around the incision,  Death,  and worsening of preexisting  medical problems.  The alternatives to surgery have been discussed as well..  Long term expectations of both operative and non operative treatments have been  discussed.   The patient agrees to proceed.       Ludger Bones A. Jaydis Duchene 06/14/2013, 10:35 AM

## 2013-07-11 NOTE — Anesthesia Postprocedure Evaluation (Signed)
  Anesthesia Post-op Note  Patient: Derrick Lawson  Procedure(s) Performed: Procedure(s): RIGHT HERNIA REPAIR INGUINAL ADULT (Right) INSERTION OF MESH (Right)  Patient Location: PACU  Anesthesia Type:GA combined with regional for post-op pain  Level of Consciousness: awake, alert  and oriented  Airway and Oxygen Therapy: Patient Spontanous Breathing  Post-op Pain: mild  Post-op Assessment: Post-op Vital signs reviewed  Post-op Vital Signs: Reviewed  Last Vitals:  Filed Vitals:   07/11/13 1515  BP: 121/79  Pulse: 86  Temp:   Resp: 19    Complications: No apparent anesthesia complications

## 2013-07-11 NOTE — Progress Notes (Signed)
Assisted Dr. Crews with right, ultrasound guided, transabdominal plane block. Side rails up, monitors on throughout procedure. See vital signs in flow sheet. Tolerated Procedure well. 

## 2013-07-15 ENCOUNTER — Encounter (HOSPITAL_BASED_OUTPATIENT_CLINIC_OR_DEPARTMENT_OTHER): Payer: Self-pay | Admitting: Surgery

## 2013-08-02 ENCOUNTER — Encounter (INDEPENDENT_AMBULATORY_CARE_PROVIDER_SITE_OTHER): Payer: Self-pay | Admitting: Surgery

## 2013-08-02 ENCOUNTER — Ambulatory Visit (INDEPENDENT_AMBULATORY_CARE_PROVIDER_SITE_OTHER): Payer: PRIVATE HEALTH INSURANCE | Admitting: Surgery

## 2013-08-02 VITALS — BP 138/88 | HR 74 | Resp 16 | Ht 69.0 in | Wt 172.8 lb

## 2013-08-02 DIAGNOSIS — Z9889 Other specified postprocedural states: Secondary | ICD-10-CM

## 2013-08-02 NOTE — Patient Instructions (Signed)
Return to full activity at end of june

## 2013-08-02 NOTE — Progress Notes (Signed)
Pt returns today after left inguinal  hernia repair.  Pain is well controlled.  Bowels are functioning.  Wound is clean.  On exam:  Incision is clean /dry/intact.  Area is soft without signs of hernia recurrence.  Impression:  Status repair of hernia left inguinal   Plan:  RTC PRN  Return to work in 3   Weeks.

## 2013-10-11 ENCOUNTER — Encounter (HOSPITAL_COMMUNITY): Payer: Self-pay | Admitting: Emergency Medicine

## 2013-10-11 ENCOUNTER — Emergency Department (INDEPENDENT_AMBULATORY_CARE_PROVIDER_SITE_OTHER)
Admission: EM | Admit: 2013-10-11 | Discharge: 2013-10-11 | Disposition: A | Discharge status: Home / Self Care | Payer: No Typology Code available for payment source | Source: Home / Self Care | Attending: Emergency Medicine | Admitting: Emergency Medicine

## 2013-10-11 DIAGNOSIS — M1009 Idiopathic gout, multiple sites: Secondary | ICD-10-CM

## 2013-10-11 DIAGNOSIS — M109 Gout, unspecified: Secondary | ICD-10-CM

## 2013-10-11 MED ORDER — PREDNISONE 50 MG PO TABS: ORAL_TABLET | ORAL | Status: DC

## 2013-10-11 MED ORDER — COLCHICINE 0.6 MG PO TABS: ORAL_TABLET | ORAL | Status: DC

## 2013-10-11 NOTE — ED Notes (Signed)
C/o right foot pain for two weeks States he thinks it is gout States he has right wrist and elbow pain OTC medication and uloric was used as tx

## 2013-10-11 NOTE — Discharge Instructions (Signed)
The Uloric is a good medicine to prevent gout. Take colchicine as instructed on the bottle. Take prednisone 1 pill daily for 5 days.  If things are not improving by Monday, please follow up with your regular doctor. If things are getting worse over the weekend or you develop fevers please come back.

## 2013-10-11 NOTE — ED Provider Notes (Signed)
CSN: [id number]     Arrival date & time 10/11/13  5093 History   First MD Initiated Contact with Patient 10/11/13 [phone]     Chief Complaint  Patient presents with  . Foot Pain   (Consider location/radiation/quality/duration/timing/severity/associated sxs/prior Treatment) HPI He is a 63 year old male here for evaluation of joint pains. He states he has had pain in his right foot, primarily at the first MTP joint, for about one week. It is associated with redness, warmth, swelling. Pain is worse with movement of the great toe. He has seen his primary care provider for this and was started on Uloric. He states he's had gout in the past and this feels the same. He states his uncle to see him prednisone in the past with good results. He also states that he has had similar pain, swelling, warmth in the right wrist and right elbow. These pains have been going on longer. Denies any fevers or chills. No known acute injury.  Past Medical History  Diagnosis Date  . Hypertension   . Gout    Past Surgical History  Procedure Laterality Date  . Inguinal hernia repair Right 07/11/2013    Procedure: RIGHT HERNIA REPAIR INGUINAL ADULT;  Surgeon: Joyice Faster. Cornett, MD;  Location: [place];  Service: General;  Laterality: Right;  . Insertion of mesh Right 07/11/2013    Procedure: INSERTION OF MESH;  Surgeon: Joyice Faster. Cornett, MD;  Location: [place];  Service: General;  Laterality: Right;   History reviewed. No pertinent family history. History  Substance Use Topics  . Smoking status: Never Smoker   . Smokeless tobacco: Never Used  . Alcohol Use: No    Review of Systems  Constitutional: Negative.   Musculoskeletal: Positive for arthralgias and joint swelling.  Skin: Negative for wound.    Allergies  Review of patient's allergies indicates no known allergies.  Home Medications   Prior to Admission medications   Medication Sig Start Date End Date Taking?  Authorizing Provider  acetaminophen (TYLENOL) 500 MG tablet Take 500 mg by mouth every 6 (six) hours as needed.    Historical Provider, MD  colchicine 0.6 MG tablet 2 tabs po x 1, then one tab po 1 hour later 10/11/13 10/11/14  Melony Overly, MD  febuxostat (ULORIC) 40 MG tablet Take 40 mg by mouth daily.    Historical Provider, MD  Olmesartan-Amlodipine-HCTZ (TRIBENZOR) 40-10-25 MG TABS Take 1 tablet by mouth daily.    Historical Provider, MD  predniSONE (DELTASONE) 50 MG tablet Take 1 pill daily for 5 days. 10/11/13   Melony Overly, MD   BP 146/97  Pulse 66  Temp(Src) 98.1 F (36.7 C) (Oral)  Resp 14  SpO2 98% Physical Exam  Constitutional: He is oriented to person, place, and time. He appears well-developed and well-nourished. No distress.  Musculoskeletal:  Right elbow: prominent olecranon bursa but at baseline per patient.  Mild tenderness to palpation. Right wrist: mild swelling, erythema, and warm; tender to palpation and passive movement. Right foot: swelling, erythema, and warmth at first MTP joint and across dorsum of foot.  Pain with passive movement of first MTP joint.  Neurological: He is alert and oriented to person, place, and time.    ED Course  Procedures (including critical care time) Labs Review Labs Reviewed - No data to display  Imaging Review No results found.   MDM   1. Acute idiopathic gout of multiple sites    We'll treat for acute flare  with colchicine and prednisone. Continue Uloric. Followup at urgent care if things are worsening or he develops fevers. If things are not improving by Monday, recommend followup with primary care provider for additional workup and evaluation.    Melony Overly, MD 10/11/13 [phone]

## 2013-10-27 ENCOUNTER — Encounter (HOSPITAL_COMMUNITY): Payer: Self-pay | Admitting: Emergency Medicine

## 2013-10-27 ENCOUNTER — Emergency Department (HOSPITAL_COMMUNITY): Payer: No Typology Code available for payment source

## 2013-10-27 ENCOUNTER — Emergency Department (HOSPITAL_COMMUNITY)
Admission: EM | Admit: 2013-10-27 | Discharge: 2013-10-27 | Disposition: A | Discharge status: Home / Self Care | Payer: No Typology Code available for payment source | Attending: Emergency Medicine | Admitting: Emergency Medicine

## 2013-10-27 DIAGNOSIS — M79609 Pain in unspecified limb: Secondary | ICD-10-CM | POA: Diagnosis present

## 2013-10-27 DIAGNOSIS — M109 Gout, unspecified: Secondary | ICD-10-CM | POA: Insufficient documentation

## 2013-10-27 DIAGNOSIS — IMO0002 Reserved for concepts with insufficient information to code with codable children: Secondary | ICD-10-CM | POA: Insufficient documentation

## 2013-10-27 DIAGNOSIS — Z79899 Other long term (current) drug therapy: Secondary | ICD-10-CM | POA: Insufficient documentation

## 2013-10-27 DIAGNOSIS — I10 Essential (primary) hypertension: Secondary | ICD-10-CM | POA: Insufficient documentation

## 2013-10-27 MED ORDER — PREDNISONE 20 MG PO TABS: 40.0000 mg | ORAL_TABLET | Freq: Every day | ORAL | Status: DC

## 2013-10-27 MED ORDER — INDOMETHACIN 25 MG PO CAPS: 25.0000 mg | ORAL_CAPSULE | Freq: Three times a day (TID) | ORAL | Status: DC | PRN

## 2013-10-27 MED ORDER — OXYCODONE-ACETAMINOPHEN 5-325 MG PO TABS: 1.0000 | ORAL_TABLET | Freq: Four times a day (QID) | ORAL | Status: DC | PRN

## 2013-10-27 NOTE — ED Notes (Signed)
Per pt sts right foot pain and hx of gout. sts has been hurting x 1 month. sts pain worse this am.

## 2013-10-27 NOTE — ED Notes (Signed)
Declined W/C at D/C and was escorted to lobby by RN. 

## 2013-10-27 NOTE — ED Provider Notes (Signed)
CSN: [id number]     Arrival date & time 10/27/13  1055 History  This chart was scribed for Michele Mcalpine, PA-C, working with Fredia Sorrow, MD by Steva Colder, ED Scribe. The patient was seen in room TR07C/TR07C at 11:16 AM.    Chief Complaint  Patient presents with  . Foot Pain      The history is provided by the patient. No language interpreter was used.   HPI Comments: Derrick Lawson is a 63 y.o. male with a medical hx of HTN and Gout who presents to the Emergency Department complaining of right foot pain onset 1 month. He states that he was seen at the Urgent Care about 2 weeks ago and was given Prednisone and Colchicine with no relief for his symptoms in his foot, however also had right elbow and wrist pain at that time which are both completely resolved. Pt states he continued to go to work which required him to be on his feet all day and did not rest his foot. Pain is throbbing, worse with pressure. No known injury or trauma. Denies fever, chills, numbness or tingling.  Past Medical History  Diagnosis Date  . Hypertension   . Gout    Past Surgical History  Procedure Laterality Date  . Inguinal hernia repair Right 07/11/2013    Procedure: RIGHT HERNIA REPAIR INGUINAL ADULT;  Surgeon: Joyice Faster. Cornett, MD;  Location: [place];  Service: General;  Laterality: Right;  . Insertion of mesh Right 07/11/2013    Procedure: INSERTION OF MESH;  Surgeon: Joyice Faster. Cornett, MD;  Location: [place];  Service: General;  Laterality: Right;   History reviewed. No pertinent family history. History  Substance Use Topics  . Smoking status: Never Smoker   . Smokeless tobacco: Never Used  . Alcohol Use: No    Review of Systems  Musculoskeletal: Positive for arthralgias (right foot ).    A complete 10 system review of systems was obtained and all systems are negative except as noted in the HPI and PMH.    Allergies  Review of patient's allergies indicates  no known allergies.  Home Medications   Prior to Admission medications   Medication Sig Start Date End Date Taking? Authorizing Provider  acetaminophen (TYLENOL) 500 MG tablet Take 500 mg by mouth every 6 (six) hours as needed.   Yes Historical Provider, MD  colchicine 0.6 MG tablet 2 tabs po x 1, then one tab po 1 hour later 10/11/13 10/11/14 Yes Melony Overly, MD  febuxostat (ULORIC) 40 MG tablet Take 40 mg by mouth daily.   Yes Historical Provider, MD  Olmesartan-Amlodipine-HCTZ (TRIBENZOR) 40-10-25 MG TABS Take 1 tablet by mouth daily.   Yes Historical Provider, MD  predniSONE (DELTASONE) 50 MG tablet Take 1 pill daily for 5 days. 10/11/13  Yes Melony Overly, MD  indomethacin (INDOCIN) 25 MG capsule Take 1 capsule (25 mg total) by mouth 3 (three) times daily as needed. 10/27/13   Illene Labrador, PA-C  oxyCODONE-acetaminophen (PERCOCET) 5-325 MG per tablet Take 1-2 tablets by mouth every 6 (six) hours as needed for severe pain. 10/27/13   Illene Labrador, PA-C  predniSONE (DELTASONE) 20 MG tablet Take 2 tablets (40 mg total) by mouth daily. 10/27/13   Illene Labrador, PA-C   BP 147/109  Pulse 65  Temp(Src) 98.3 F (36.8 C) (Oral)  SpO2 99%  Physical Exam  Nursing note and vitals reviewed. Constitutional: He is oriented to person, place,  and time. He appears well-developed and well-nourished. No distress.  HENT:  Head: Normocephalic and atraumatic.  Eyes: Conjunctivae and EOM are normal.  Neck: Normal range of motion. Neck supple.  Cardiovascular: Normal rate, regular rhythm, normal heart sounds and intact distal pulses.   +2 DP/PT pulse on right.  Pulmonary/Chest: Effort normal and breath sounds normal.  Musculoskeletal:  Tenderness and swelling to right foot over first MTP, slight swelling over metatarsals. Pain to right first MTP with movement of big toe. R ankle normal.  Neurological: He is alert and oriented to person, place, and time.  Skin: Skin is warm and dry.  Psychiatric: He has  a normal mood and affect. His behavior is normal.    ED Course  Procedures (including critical care time) DIAGNOSTIC STUDIES: Oxygen Saturation is 99% on room air, normal by my interpretation.    COORDINATION OF CARE: 11:20 AM-Discussed treatment plan which includes X-ray of right foot with pt at bedside and pt agreed to plan.   Labs Review Labs Reviewed - No data to display  Imaging Review Dg Foot Complete Right  10/27/2013   CLINICAL DATA:  63 year old male with right foot pain and swelling. Initial encounter. History of gout.  EXAM: RIGHT FOOT COMPLETE - 3+ VIEW  COMPARISON:  06/21/2011.  FINDINGS: Stable bone mineralization, within normal limits for age. Calcaneus is stable and intact. Joint spaces and alignment are stable and within normal limits. No acute fracture or dislocation identified. Minimal to mild first MTP joint space loss and subchondral sclerosis has not significantly changed. Forefoot soft tissue swelling. No subcutaneous gas.  IMPRESSION: No acute osseous abnormality identified in the right foot.   Electronically Signed   By: Lars Pinks M.D.   On: 10/27/2013 13:14     EKG Interpretation None      MDM   Final diagnoses:  Gout of big toe   Pt presenting with right foot pain, appearance of gout. Recent treatment with prednisone and colchicine with successful resolution of wrist and elbow pain but not foot. Afebrile. Does not appear to be infected. Xray without any acute findings. He is able to ambulate without difficulty. Will give a second short course of prednisone along with indomethacin. I advised him to f/u with his PCP to have his uric acid levels checked and discuss adjustment of his daily gout medications. Stable for d/c. Return precautions given. Patient states understanding of treatment care plan and is agreeable.  I personally performed the services described in this documentation, which was scribed in my presence. The recorded information has been reviewed  and is accurate.    Illene Labrador, PA-C 10/27/13 [phone]

## 2013-10-27 NOTE — Discharge Instructions (Signed)
Take percocet for severe pain only. No driving or operating heavy machinery while taking percocet. This medication may cause drowsiness. Take medications as prescribed. Followup with your primary care physician.  Gout Gout is an inflammatory arthritis caused by a buildup of uric acid crystals in the joints. Uric acid is a chemical that is normally present in the blood. When the level of uric acid in the blood is too high it can form crystals that deposit in your joints and tissues. This causes joint redness, soreness, and swelling (inflammation). Repeat attacks are common. Over time, uric acid crystals can form into masses (tophi) near a joint, destroying bone and causing disfigurement. Gout is treatable and often preventable. CAUSES  The disease begins with elevated levels of uric acid in the blood. Uric acid is produced by your body when it breaks down a naturally found substance called purines. Certain foods you eat, such as meats and fish, contain high amounts of purines. Causes of an elevated uric acid level include:  Being passed down from parent to child (heredity).  Diseases that cause increased uric acid production (such as obesity, psoriasis, and certain cancers).  Excessive alcohol use.  Diet, especially diets rich in meat and seafood.  Medicines, including certain cancer-fighting medicines (chemotherapy), water pills (diuretics), and aspirin.  Chronic kidney disease. The kidneys are no longer able to remove uric acid well.  Problems with metabolism. Conditions strongly associated with gout include:  Obesity.  High blood pressure.  High cholesterol.  Diabetes. Not everyone with elevated uric acid levels gets gout. It is not understood why some people get gout and others do not. Surgery, joint injury, and eating too much of certain foods are some of the factors that can lead to gout attacks. SYMPTOMS   An attack of gout comes on quickly. It causes intense pain with redness,  swelling, and warmth in a joint.  Fever can occur.  Often, only one joint is involved. Certain joints are more commonly involved:  Base of the big toe.  Knee.  Ankle.  Wrist.  Finger. Without treatment, an attack usually goes away in a few days to weeks. Between attacks, you usually will not have symptoms, which is different from many other forms of arthritis. DIAGNOSIS  Your caregiver will suspect gout based on your symptoms and exam. In some cases, tests may be recommended. The tests may include:  Blood tests.  Urine tests.  X-rays.  Joint fluid exam. This exam requires a needle to remove fluid from the joint (arthrocentesis). Using a microscope, gout is confirmed when uric acid crystals are seen in the joint fluid. TREATMENT  There are two phases to gout treatment: treating the sudden onset (acute) attack and preventing attacks (prophylaxis).  Treatment of an Acute Attack.  Medicines are used. These include anti-inflammatory medicines or steroid medicines.  An injection of steroid medicine into the affected joint is sometimes necessary.  The painful joint is rested. Movement can worsen the arthritis.  You may use warm or cold treatments on painful joints, depending which works best for you.  Treatment to Prevent Attacks.  If you suffer from frequent gout attacks, your caregiver may advise preventive medicine. These medicines are started after the acute attack subsides. These medicines either help your kidneys eliminate uric acid from your body or decrease your uric acid production. You may need to stay on these medicines for a very long time.  The early phase of treatment with preventive medicine can be associated with an increase in  acute gout attacks. For this reason, during the first few months of treatment, your caregiver may also advise you to take medicines usually used for acute gout treatment. Be sure you understand your caregiver's directions. Your caregiver may  make several adjustments to your medicine dose before these medicines are effective.  Discuss dietary treatment with your caregiver or dietitian. Alcohol and drinks high in sugar and fructose and foods such as meat, poultry, and seafood can increase uric acid levels. Your caregiver or dietitian can advise you on drinks and foods that should be limited. HOME CARE INSTRUCTIONS   Do not take aspirin to relieve pain. This raises uric acid levels.  Only take over-the-counter or prescription medicines for pain, discomfort, or fever as directed by your caregiver.  Rest the joint as much as possible. When in bed, keep sheets and blankets off painful areas.  Keep the affected joint raised (elevated).  Apply warm or cold treatments to painful joints. Use of warm or cold treatments depends on which works best for you.  Use crutches if the painful joint is in your leg.  Drink enough fluids to keep your urine clear or pale yellow. This helps your body get rid of uric acid. Limit alcohol, sugary drinks, and fructose drinks.  Follow your dietary instructions. Pay careful attention to the amount of protein you eat. Your daily diet should emphasize fruits, vegetables, whole grains, and fat-free or low-fat milk products. Discuss the use of coffee, vitamin C, and cherries with your caregiver or dietitian. These may be helpful in lowering uric acid levels.  Maintain a healthy body weight. SEEK MEDICAL CARE IF:   You develop diarrhea, vomiting, or any side effects from medicines.  You do not feel better in 24 hours, or you are getting worse. SEEK IMMEDIATE MEDICAL CARE IF:   Your joint becomes suddenly more tender, and you have chills or a fever. MAKE SURE YOU:   Understand these instructions.  Will watch your condition.  Will get help right away if you are not doing well or get worse. Document Released: 02/12/2000 Document Revised: 07/01/2013 Document Reviewed: 09/28/2011 [facility] Patient  Information 2015 [place], [place]. This information is not intended to replace advice given to you by your health care provider. Make sure you discuss any questions you have with your health care provider.  Low-Purine Diet Purines are compounds that affect the level of uric acid in your body. A low-purine diet is a diet that is low in purines. Eating a low-purine diet can prevent the level of uric acid in your body from getting too high and causing gout or kidney stones or both. WHAT DO I NEED TO KNOW ABOUT THIS DIET?  Choose low-purine foods. Examples of low-purine foods are listed in the next section.  Drink plenty of fluids, especially water. Fluids can help remove uric acid from your body. Try to drink 8-16 cups (1.9-3.8 L) a day.  Limit foods high in fat, especially saturated fat, as fat makes it harder for the body to get rid of uric acid. Foods high in saturated fat include pizza, cheese, ice cream, whole milk, fried foods, and gravies. Choose foods that are lower in fat and lean sources of protein. Use olive oil when cooking as it contains healthy fats that are not high in saturated fat.  Limit alcohol. Alcohol interferes with the elimination of uric acid from your body. If you are having a gout attack, avoid all alcohol.  Keep in mind that different people's bodies react  differently to different foods. You will probably learn over time which foods do or do not affect you. If you discover that a food tends to cause your gout to flare up, avoid eating that food. You can more freely enjoy foods that do not cause problems. If you have any questions about a food item, talk to your dietitian or health care provider. WHICH FOODS ARE LOW, MODERATE, AND HIGH IN PURINES? The following is a list of foods that are low, moderate, and high in purines. You can eat any amount of the foods that are low in purines. You may be able to have small amounts of foods that are moderate in purines. Ask your health care  provider how much of a food moderate in purines you can have. Avoid foods high in purines. Grains  Foods low in purines: Enriched white bread, pasta, rice, cake, cornbread, popcorn.  Foods moderate in purines: Whole-grain breads and cereals, wheat germ, bran, oatmeal. Uncooked oatmeal. Dry wheat bran or wheat germ.  Foods high in purines: Pancakes, [place] toast, biscuits, muffins. Vegetables  Foods low in purines: All vegetables, except those that are moderate in purines.  Foods moderate in purines: Asparagus, cauliflower, spinach, mushrooms, green peas. Fruits  All fruits are low in purines. Meats and other Protein Foods  Foods low in purines: Eggs, nuts, peanut butter.  Foods moderate in purines: 80-90% lean beef, lamb, veal, pork, poultry, fish, eggs, peanut butter, nuts. Crab, lobster, oysters, and shrimp. Cooked dried beans, peas, and lentils.  Foods high in purines: Anchovies, sardines, herring, mussels, tuna, codfish, scallops, trout, and haddock. Berniece Salines. Organ meats (such as liver or kidney). Tripe. Game meat. Goose. Sweetbreads. Dairy  All dairy foods are low in purines. Low-fat and fat-free dairy products are best because they are low in saturated fat. Beverages  Drinks low in purines: Water, carbonated beverages, tea, coffee, cocoa.  Drinks moderate in purines: Soft drinks and other drinks sweetened with high-fructose corn syrup. Juices. To find whether a food or drink is sweetened with high-fructose corn syrup, look at the ingredients list.  Drinks high in purines: Alcoholic beverages (such as beer). Condiments  Foods low in purines: Salt, herbs, olives, pickles, relishes, vinegar.  Foods moderate in purines: Butter, margarine, oils, mayonnaise. Fats and Oils  Foods low in purines: All types, except gravies and sauces made with meat.  Foods high in purines: Gravies and sauces made with meat. Other Foods  Foods low in purines: Sugars, sweets, gelatin. Cake.  Soups made without meat.  Foods moderate in purines: Meat-based or fish-based soups, broths, or bouillons. Foods and drinks sweetened with high-fructose corn syrup.  Foods high in purines: High-fat desserts (such as ice cream, cookies, cakes, pies, doughnuts, and chocolate). Contact your dietitian for more information on foods that are not listed here. Document Released: 06/11/2010 Document Revised: 02/19/2013 Document Reviewed: 01/21/2013 [facility] Patient Information 2015 [place], [place]. This information is not intended to replace advice given to you by your health care provider. Make sure you discuss any questions you have with your health care provider.

## 2013-10-28 NOTE — ED Provider Notes (Signed)
Medical screening examination/treatment/procedure(s) were performed by non-physician practitioner and as supervising physician I was immediately available for consultation/collaboration.   EKG Interpretation None        Fredia Sorrow, MD 10/28/13 1126

## 2014-03-07 ENCOUNTER — Ambulatory Visit (HOSPITAL_COMMUNITY)
Admission: RE | Admit: 2014-03-07 | Discharge: 2014-03-07 | Disposition: A | Discharge status: Home / Self Care | Payer: No Typology Code available for payment source | Source: Ambulatory Visit | Attending: Pulmonary Disease | Admitting: Pulmonary Disease

## 2014-03-07 ENCOUNTER — Other Ambulatory Visit (HOSPITAL_COMMUNITY): Payer: Self-pay | Admitting: Pulmonary Disease

## 2014-03-07 DIAGNOSIS — M2550 Pain in unspecified joint: Secondary | ICD-10-CM

## 2014-03-07 DIAGNOSIS — M25651 Stiffness of right hip, not elsewhere classified: Secondary | ICD-10-CM | POA: Diagnosis present

## 2014-03-13 ENCOUNTER — Encounter: Payer: Self-pay | Admitting: Internal Medicine

## 2014-06-07 ENCOUNTER — Encounter (HOSPITAL_COMMUNITY): Payer: Self-pay | Admitting: *Deleted

## 2014-06-07 ENCOUNTER — Emergency Department (HOSPITAL_COMMUNITY)
Admission: EM | Admit: 2014-06-07 | Discharge: 2014-06-07 | Disposition: A | Discharge status: Home / Self Care | Payer: No Typology Code available for payment source | Attending: Emergency Medicine | Admitting: Emergency Medicine

## 2014-06-07 DIAGNOSIS — Z79899 Other long term (current) drug therapy: Secondary | ICD-10-CM | POA: Diagnosis not present

## 2014-06-07 DIAGNOSIS — Z7952 Long term (current) use of systemic steroids: Secondary | ICD-10-CM | POA: Diagnosis not present

## 2014-06-07 DIAGNOSIS — Z8739 Personal history of other diseases of the musculoskeletal system and connective tissue: Secondary | ICD-10-CM | POA: Diagnosis not present

## 2014-06-07 DIAGNOSIS — I10 Essential (primary) hypertension: Secondary | ICD-10-CM | POA: Diagnosis not present

## 2014-06-07 DIAGNOSIS — M25562 Pain in left knee: Secondary | ICD-10-CM | POA: Diagnosis not present

## 2014-06-07 MED ORDER — NAPROXEN SODIUM 220 MG PO TABS: 220.0000 mg | ORAL_TABLET | Freq: Two times a day (BID) | ORAL | Status: DC

## 2014-06-07 NOTE — ED Notes (Signed)
Patient is alert and oriented x3.  He is complaining of left knee pain and stiffness that started Thursday.  Patient was seen at his PCP on thursday for a general checkup and nothing was  Given for this issue.  Patient states that he tried to go to work this morning and was having  More difficulty walking.  Currently he rates his pain 7 of 10.

## 2014-06-07 NOTE — ED Provider Notes (Signed)
CSN: [id number]     Arrival date & time 06/07/14  0620 History   First MD Initiated Contact with Patient 06/07/14 0701     Chief Complaint  Patient presents with  . Knee Pain     (Consider location/radiation/quality/duration/timing/severity/associated sxs/prior Treatment) Patient is a 64 y.o. male presenting with knee pain.  Knee Pain Location:  Knee Time since incident:  3 days Injury: no   Knee location:  L knee Pain details:    Quality:  Throbbing   Radiates to:  Does not radiate   Severity:  Severe   Onset quality:  Gradual   Duration:  3 days   Timing:  Constant   Progression:  Worsening Chronicity:  New Relieved by: hydrocodone. Worsened by:  Bearing weight Associated symptoms: stiffness and swelling   Associated symptoms: no decreased ROM, no fever, no muscle weakness and no numbness   Associated symptoms comment:  Worse in the morning   Past Medical History  Diagnosis Date  . Hypertension   . Gout    Past Surgical History  Procedure Laterality Date  . Inguinal hernia repair Right 07/11/2013    Procedure: RIGHT HERNIA REPAIR INGUINAL ADULT;  Surgeon: Joyice Faster. Cornett, MD;  Location: [place];  Service: General;  Laterality: Right;  . Insertion of mesh Right 07/11/2013    Procedure: INSERTION OF MESH;  Surgeon: Joyice Faster. Cornett, MD;  Location: [place];  Service: General;  Laterality: Right;   No family history on file. History  Substance Use Topics  . Smoking status: Never Smoker   . Smokeless tobacco: Never Used  . Alcohol Use: No    Review of Systems  Constitutional: Negative for fever.  Musculoskeletal: Positive for stiffness.  All other systems reviewed and are negative.     Allergies  Review of patient's allergies indicates no known allergies.  Home Medications   Prior to Admission medications   Medication Sig Start Date End Date Taking? Authorizing Provider  acetaminophen (TYLENOL) 500 MG tablet Take 500  mg by mouth every 6 (six) hours as needed.    Historical Provider, MD  colchicine 0.6 MG tablet 2 tabs po x 1, then one tab po 1 hour later 10/11/13 10/11/14  Melony Overly, MD  febuxostat (ULORIC) 40 MG tablet Take 40 mg by mouth daily.    Historical Provider, MD  indomethacin (INDOCIN) 25 MG capsule Take 1 capsule (25 mg total) by mouth 3 (three) times daily as needed. 10/27/13   Robyn M Hess, PA-C  Olmesartan-Amlodipine-HCTZ (TRIBENZOR) 40-10-25 MG TABS Take 1 tablet by mouth daily.    Historical Provider, MD  oxyCODONE-acetaminophen (PERCOCET) 5-325 MG per tablet Take 1-2 tablets by mouth every 6 (six) hours as needed for severe pain. 10/27/13   Carman Ching, PA-C  predniSONE (DELTASONE) 20 MG tablet Take 2 tablets (40 mg total) by mouth daily. 10/27/13   Carman Ching, PA-C  predniSONE (DELTASONE) 50 MG tablet Take 1 pill daily for 5 days. 10/11/13   Melony Overly, MD   BP 132/92 mmHg  Pulse 63  Temp(Src) 98.1 F (36.7 C) (Oral)  Resp 18  Ht 5\' 9"  (1.753 m)  Wt 175 lb (79.379 kg)  BMI 25.83 kg/m2  SpO2 95% Physical Exam  Constitutional: He is oriented to person, place, and time. He appears well-developed and well-nourished. No distress.  HENT:  Head: Normocephalic and atraumatic.  Eyes: Conjunctivae are normal. No scleral icterus.  Neck: Neck supple.  Cardiovascular: Normal rate  and intact distal pulses.   Pulmonary/Chest: Effort normal. No stridor. No respiratory distress.  Abdominal: Normal appearance. He exhibits no distension.  Musculoskeletal:       Left knee: He exhibits normal range of motion (NV intact distally), no swelling, no effusion, no deformity, no erythema, normal alignment, no LCL laxity, normal patellar mobility and no MCL laxity. Tenderness found.  Neurological: He is alert and oriented to person, place, and time.  Skin: Skin is warm and dry. No rash noted.  Psychiatric: He has a normal mood and affect. His behavior is normal.  Nursing note and vitals reviewed.   ED  Course  Procedures (including critical care time) Labs Review Labs Reviewed - No data to display  Imaging Review No results found.   EKG Interpretation None      MDM   Final diagnoses:  Left knee pain    Pt with a few days of left knee pain.  No injury initially, but he did stumble on it yesterday (did not fall), exacerbating pain.  He has a hx of gout, but does not have appreciable joint effusion, redness, warmth on exam.  Likely has mild flare up of arthritis.  Plan NSAIDs and follow up.  Return precautions given.      [name], MD 06/07/14 [phone]

## 2014-06-07 NOTE — Discharge Instructions (Signed)

## 2014-08-21 ENCOUNTER — Ambulatory Visit: Payer: 59

## 2014-08-21 ENCOUNTER — Encounter: Payer: Self-pay | Admitting: Podiatry

## 2014-08-21 ENCOUNTER — Ambulatory Visit (INDEPENDENT_AMBULATORY_CARE_PROVIDER_SITE_OTHER): Payer: 59 | Admitting: Podiatry

## 2014-08-21 VITALS — BP 156/97 | HR 54 | Resp 12

## 2014-08-21 DIAGNOSIS — R52 Pain, unspecified: Secondary | ICD-10-CM

## 2014-08-21 DIAGNOSIS — M779 Enthesopathy, unspecified: Secondary | ICD-10-CM | POA: Diagnosis not present

## 2014-08-21 DIAGNOSIS — M1000 Idiopathic gout, unspecified site: Secondary | ICD-10-CM | POA: Diagnosis not present

## 2014-08-21 MED ORDER — TRIAMCINOLONE ACETONIDE 10 MG/ML IJ SUSP
10.0000 mg | Freq: Once | INTRAMUSCULAR | Status: AC
  Administered 2014-08-21: 10 mg

## 2014-08-21 MED ORDER — METHYLPREDNISOLONE 4 MG PO TBPK
ORAL_TABLET | ORAL | Status: DC
Start: 2014-08-21 — End: 2017-07-03

## 2014-08-21 NOTE — Progress Notes (Signed)
   Subjective:    Patient ID: Derrick Lawson, male    DOB: 07-Mar-1950, 64 y.o.   MRN: [id number]  HPI Patient thinks he may have a bone deformity on the outside of his foot at the bottom of 5th toe. He is in clear discomfort with inflammation present throughout the foot. Patient acknowledged that he has gout and may have arthritis as well, and that this problem may be the result of a flare-up.   Review of Systems  Musculoskeletal: Positive for gait problem.  All other systems reviewed and are negative.      Objective:   Physical Exam        Assessment & Plan:

## 2014-08-21 NOTE — Progress Notes (Signed)
Subjective:     Patient ID: Derrick Lawson, male   DOB: August 21, 1950, 64 y.o.   MRN: [id number]  HPI patient states I'm getting a lot of pain in the outside of my left foot with inflammation and fluid buildup and I've had numerous different attacks of gout in different parts of my body with my right elbow having a large nodule   Review of Systems  All other systems reviewed and are negative.      Objective:   Physical Exam  Constitutional: He is oriented to person, place, and time.  Cardiovascular: Intact distal pulses.   Musculoskeletal: Normal range of motion.  Neurological: He is oriented to person, place, and time.  Skin: Skin is warm.  Nursing note and vitals reviewed.  neurovascular status intact muscle strength adequate range of motion within normal limits. Patient does have quite a bit of edema in the left lateral foot with pain around the fifth metatarsal base with fluid buildup and is noted to be well oriented 3 with good digital perfusion. Patient has other sites that he is developed this but is not had it in the big toe joint for a long time     Assessment:     Inflammatory tendinitis left lateral foot with the possibility for gout-like symptomatology    Plan:     H&P and x-rays reviewed with patient. Injected the lateral tendon as it inserts into the base of the fifth metatarsal 3 mg Kenalog Xylocaine and then went ahead and gave instructions on gout diet and also recommended rheumatologist who he will see from his family physician for referral and is needed

## 2014-08-21 NOTE — Patient Instructions (Signed)

## 2014-09-02 ENCOUNTER — Other Ambulatory Visit (HOSPITAL_COMMUNITY): Payer: Self-pay | Admitting: Nurse Practitioner

## 2014-09-02 DIAGNOSIS — B182 Chronic viral hepatitis C: Secondary | ICD-10-CM

## 2014-09-17 ENCOUNTER — Telehealth (HOSPITAL_COMMUNITY): Payer: Self-pay

## 2014-09-17 NOTE — Telephone Encounter (Signed)
Called pt to remind him of 7am appt at [facility], no answer for home number. I also tried to call cell number that was in pt chart, but a woman answered and stated that I had the wrong number. AW

## 2014-09-18 ENCOUNTER — Ambulatory Visit (HOSPITAL_COMMUNITY)
Admission: RE | Admit: 2014-09-18 | Discharge: 2014-09-18 | Disposition: A | Discharge status: Home / Self Care | Payer: No Typology Code available for payment source | Source: Ambulatory Visit | Attending: Nurse Practitioner | Admitting: Nurse Practitioner

## 2014-09-18 DIAGNOSIS — N281 Cyst of kidney, acquired: Secondary | ICD-10-CM | POA: Insufficient documentation

## 2014-09-18 DIAGNOSIS — B182 Chronic viral hepatitis C: Secondary | ICD-10-CM | POA: Diagnosis present

## 2014-10-30 ENCOUNTER — Encounter (HOSPITAL_COMMUNITY): Payer: Self-pay | Admitting: *Deleted

## 2014-10-30 ENCOUNTER — Emergency Department (HOSPITAL_COMMUNITY)
Admission: EM | Admit: 2014-10-30 | Discharge: 2014-10-30 | Disposition: A | Discharge status: Home / Self Care | Payer: No Typology Code available for payment source | Attending: Emergency Medicine | Admitting: Emergency Medicine

## 2014-10-30 DIAGNOSIS — M109 Gout, unspecified: Secondary | ICD-10-CM | POA: Insufficient documentation

## 2014-10-30 DIAGNOSIS — I10 Essential (primary) hypertension: Secondary | ICD-10-CM | POA: Diagnosis not present

## 2014-10-30 DIAGNOSIS — M1000 Idiopathic gout, unspecified site: Secondary | ICD-10-CM

## 2014-10-30 DIAGNOSIS — M79672 Pain in left foot: Secondary | ICD-10-CM | POA: Diagnosis present

## 2014-10-30 MED ORDER — OXYCODONE-ACETAMINOPHEN 5-325 MG PO TABS: 2.0000 | ORAL_TABLET | ORAL | Status: DC | PRN

## 2014-10-30 MED ORDER — INDOMETHACIN 25 MG PO CAPS: 25.0000 mg | ORAL_CAPSULE | Freq: Three times a day (TID) | ORAL | Status: DC | PRN

## 2014-10-30 NOTE — ED Provider Notes (Signed)
CSN: [id number]     Arrival date & time 10/30/14  1449 History  This chart was scribed for non-physician practitioner [name], NP working with Tanna Furry, MD by Hilda Lias, ED Scribe. This patient was seen in room WTR5/WTR5 and the patient's care was started at 3:01 PM.    No chief complaint on file.     The history is provided by the patient. No language interpreter was used.     HPI Comments: Derrick Lawson is a 64 y.o. male with gout who presents to the Emergency Department complaining of constant, worsening left knee and left great toe pain with associated swelling that has been present for a few weeks. Pt states his left knee feels stiff, and states he has had it drained before. Pt reports that his gait has been altered because of the swelling in pain in his left knee and great toe. Pt reports his left great toe is painful from gout. Pt denies any known injury to either area and denies any recent falls.    Past Medical History  Diagnosis Date  . Hypertension   . Gout    Past Surgical History  Procedure Laterality Date  . Inguinal hernia repair Right 07/11/2013    Procedure: RIGHT HERNIA REPAIR INGUINAL ADULT;  Surgeon: Joyice Faster. Cornett, MD;  Location: [place];  Service: General;  Laterality: Right;  . Insertion of mesh Right 07/11/2013    Procedure: INSERTION OF MESH;  Surgeon: Joyice Faster. Cornett, MD;  Location: [place];  Service: General;  Laterality: Right;   No family history on file. Social History  Substance Use Topics  . Smoking status: Never Smoker   . Smokeless tobacco: Never Used  . Alcohol Use: No    Review of Systems  Constitutional: Negative for fever.  Musculoskeletal: Positive for joint swelling, arthralgias and gait problem.  All other systems reviewed and are negative.     Allergies  Review of patient's allergies indicates no known allergies.  Home Medications   Prior to Admission medications    Medication Sig Start Date End Date Taking? Authorizing Provider  colchicine 0.6 MG tablet 2 tabs po x 1, then one tab po 1 hour later Patient not taking: Reported on 06/07/2014 10/11/13 10/11/14  Melony Overly, MD  indomethacin (INDOCIN) 25 MG capsule Take 1 capsule (25 mg total) by mouth 3 (three) times daily as needed. 10/27/13   Carman Ching, PA-C  methylPREDNISolone (MEDROL DOSEPAK) 4 MG TBPK tablet follow package directions 08/21/14   Wallene Huh, DPM  naproxen sodium (ALEVE) 220 MG tablet Take 1 tablet (220 mg total) by mouth 2 (two) times daily with a meal. 06/07/14   [name], MD  Olmesartan-Amlodipine-HCTZ [facility]) 40-10-25 MG TABS Take 1 tablet by mouth daily.    Historical Provider, MD  oxyCODONE-acetaminophen (PERCOCET) 5-325 MG per tablet Take 1-2 tablets by mouth every 6 (six) hours as needed for severe pain. 10/27/13   Carman Ching, PA-C  predniSONE (DELTASONE) 20 MG tablet Take 2 tablets (40 mg total) by mouth daily. Patient not taking: Reported on 06/07/2014 10/27/13   Carman Ching, PA-C  predniSONE (DELTASONE) 50 MG tablet Take 1 pill daily for 5 days. Patient not taking: Reported on 06/07/2014 10/11/13   Melony Overly, MD  Vitamin D, Ergocalciferol, (DRISDOL) 50000 UNITS CAPS capsule Take 50,000 Units by mouth once a week. 06/05/14   Historical Provider, MD   BP 134/90 mmHg  Pulse 68  Temp(Src) 98 F (36.7 C) (Oral)  Resp 18  SpO2 100% Physical Exam  Constitutional: He is oriented to person, place, and time. He appears well-developed and well-nourished.  HENT:  Head: Normocephalic and atraumatic.  Cardiovascular: Normal rate.   Pulmonary/Chest: Effort normal.  Abdominal: He exhibits no distension.  Musculoskeletal:  Red swelling noted at the base of the left great toe. No obvious deformity noted to the left knee. Full rom  Neurological: He is alert and oriented to person, place, and time.  Skin: Skin is warm and dry.  Psychiatric: He has a normal mood and affect.  Nursing  note and vitals reviewed.   ED Course  Procedures (including critical care time)  DIAGNOSTIC STUDIES: Oxygen Saturation is 100% on room air, normal by my interpretation.    COORDINATION OF CARE: 3:06 PM Discussed treatment plan with pt at bedside and pt agreed to plan.   Labs Review Labs Reviewed - No data to display  Imaging Review No results found. I have personally reviewed and evaluated these images and lab results as part of my medical decision-making.   EKG Interpretation None      MDM   Final diagnoses:  Acute idiopathic gout, unspecified site    Exam and history consistent with joint. No concern for septic joint. Pt given indomethacin and oxycodone for symptoms  I personally performed the services described in this documentation, which was scribed in my presence. The recorded information has been reviewed and is accurate.    [name], NP 10/30/14 [place], MD 11/02/14 [phone]

## 2014-10-30 NOTE — ED Notes (Signed)
Pt complains of pain in his left knee and left great toe for the past 2 weeks. Pt has hx of gout. Pt was told in April that he had bursitis in his left knee.

## 2014-10-30 NOTE — Discharge Instructions (Signed)
Gout °Gout is when your joints become red, sore, and swell (inflamed). This is caused by the buildup of uric acid crystals in the joints. Uric acid is a chemical that is normally in the blood. If the level of uric acid gets too high in the blood, these crystals form in your joints and tissues. Over time, these crystals can form into masses near the joints and tissues. These masses can destroy bone and cause the bone to look misshapen (deformed). °HOME CARE  °· Do not take aspirin for pain. °· Only take medicine as told by your doctor. °· Rest the joint as much as you can. When in bed, keep sheets and blankets off painful areas. °· Keep the sore joints raised (elevated). °· Put warm or cold packs on painful joints. Use of warm or cold packs depends on which works best for you. °· Use crutches if the painful joint is in your leg. °· Drink enough fluids to keep your pee (urine) clear or pale yellow. Limit alcohol, sugary drinks, and drinks with fructose in them. °· Follow your diet instructions. Pay careful attention to how much protein you eat. Include fruits, vegetables, whole grains, and fat-free or low-fat milk products in your daily diet. Talk to your doctor or dietitian about the use of coffee, vitamin C, and cherries. These may help lower uric acid levels. °· Keep a healthy body weight. °GET HELP RIGHT AWAY IF:  °· You have watery poop (diarrhea), throw up (vomit), or have any side effects from medicines. °· You do not feel better in 24 hours, or you are getting worse. °· Your joint becomes suddenly more tender, and you have chills or a fever. °MAKE SURE YOU:  °· Understand these instructions. °· Will watch your condition. °· Will get help right away if you are not doing well or get worse. °Document Released: 11/24/2007 Document Revised: 07/01/2013 Document Reviewed: 09/28/2011 °ExitCare® Patient Information ©2015 [employer]. This information is not intended to replace advice given to you by your health care  provider. Make sure you discuss any questions you have with your health care provider. ° °

## 2014-12-11 ENCOUNTER — Other Ambulatory Visit: Payer: Self-pay | Admitting: Nurse Practitioner

## 2014-12-11 DIAGNOSIS — C220 Liver cell carcinoma: Secondary | ICD-10-CM

## 2015-03-13 ENCOUNTER — Other Ambulatory Visit: Payer: No Typology Code available for payment source

## 2015-03-23 ENCOUNTER — Ambulatory Visit
Admission: RE | Admit: 2015-03-23 | Discharge: 2015-03-23 | Disposition: A | Discharge status: Home / Self Care | Payer: 59 | Source: Ambulatory Visit | Attending: Nurse Practitioner | Admitting: Nurse Practitioner

## 2015-03-23 DIAGNOSIS — C220 Liver cell carcinoma: Secondary | ICD-10-CM

## 2015-04-22 ENCOUNTER — Other Ambulatory Visit: Payer: Self-pay | Admitting: Nurse Practitioner

## 2015-04-22 DIAGNOSIS — K7460 Unspecified cirrhosis of liver: Secondary | ICD-10-CM

## 2015-05-13 IMAGING — CR DG HIP (WITH OR WITHOUT PELVIS) 2V BILAT
5 series · 5 of 5 positions shown · non-contrast
Comparison: None.

CLINICAL DATA: Right hip stiffness. No known injury. Initial
evaluation.

EXAM:
DG HIP W/ PELVIS 2V BILAT

[pelvis ap]
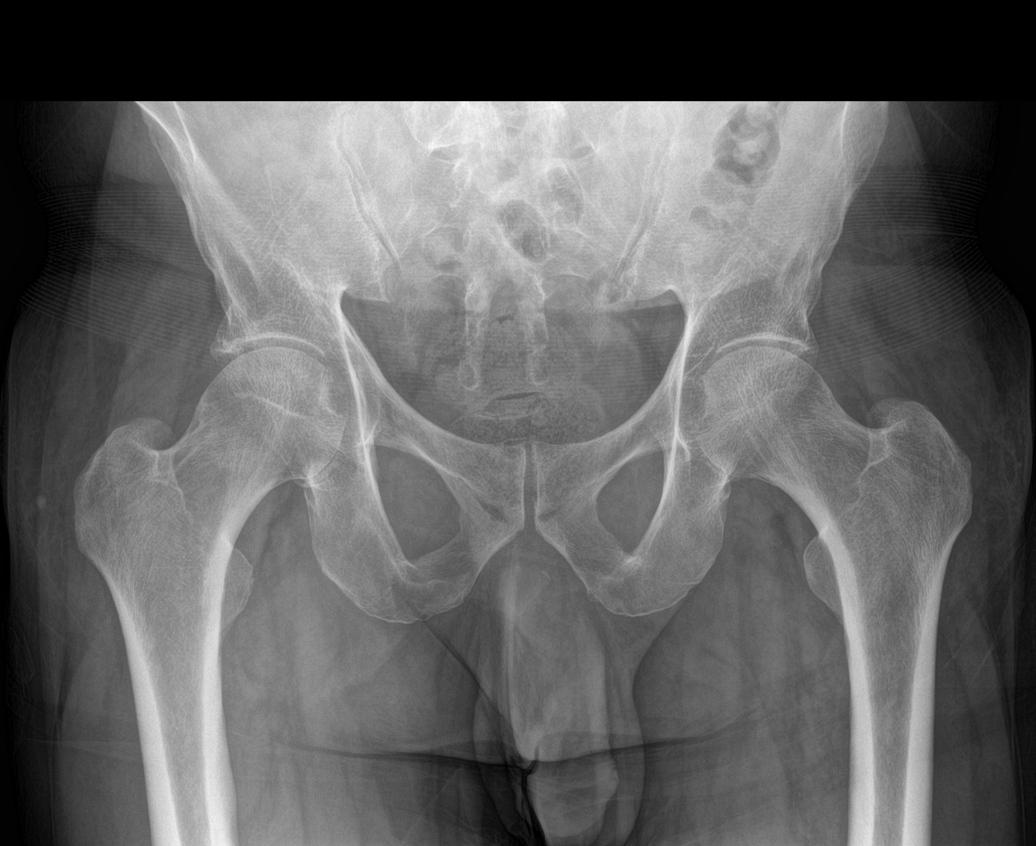

[hip ap (1 of 2)]
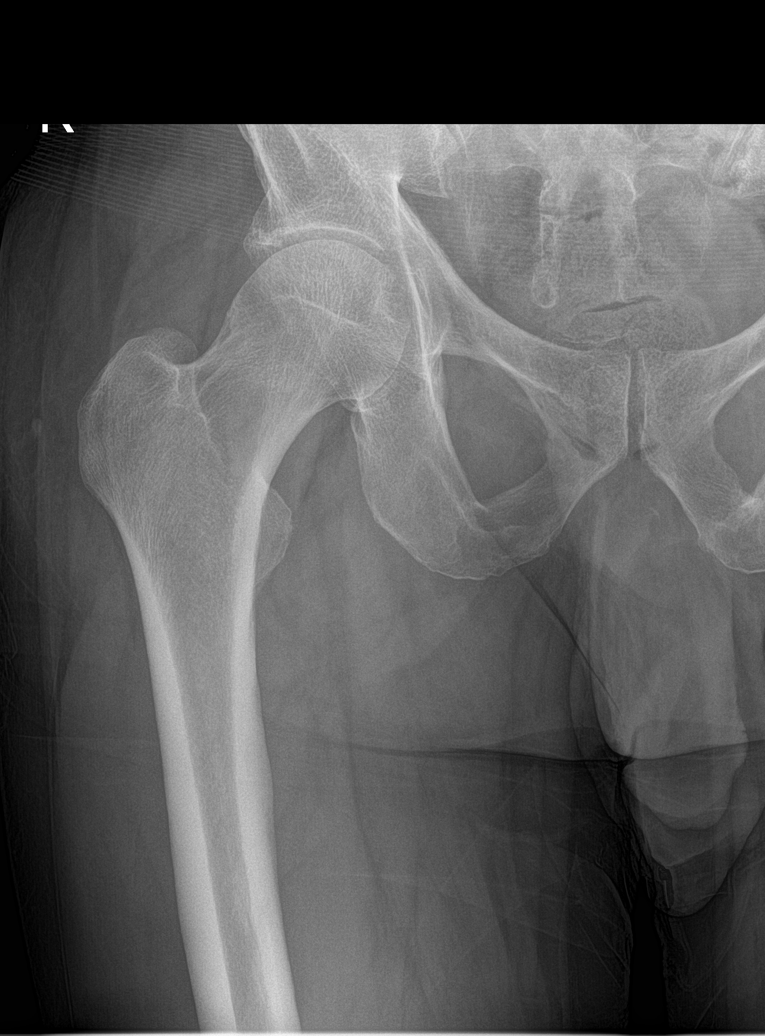

[hip ap (2 of 2)]
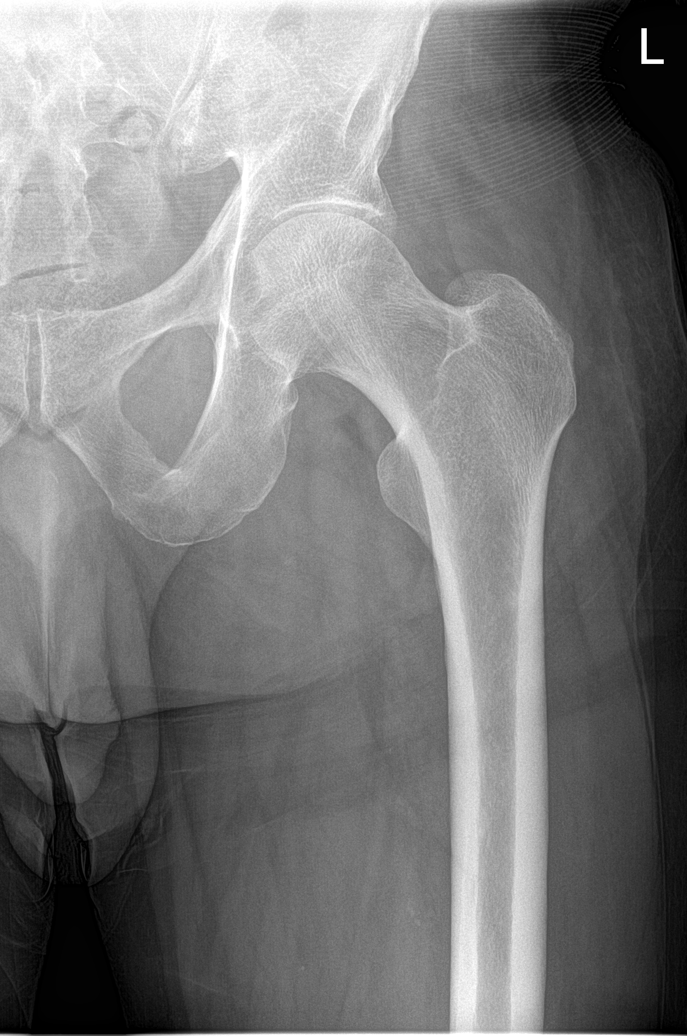

[hip lat (1 of 2)]
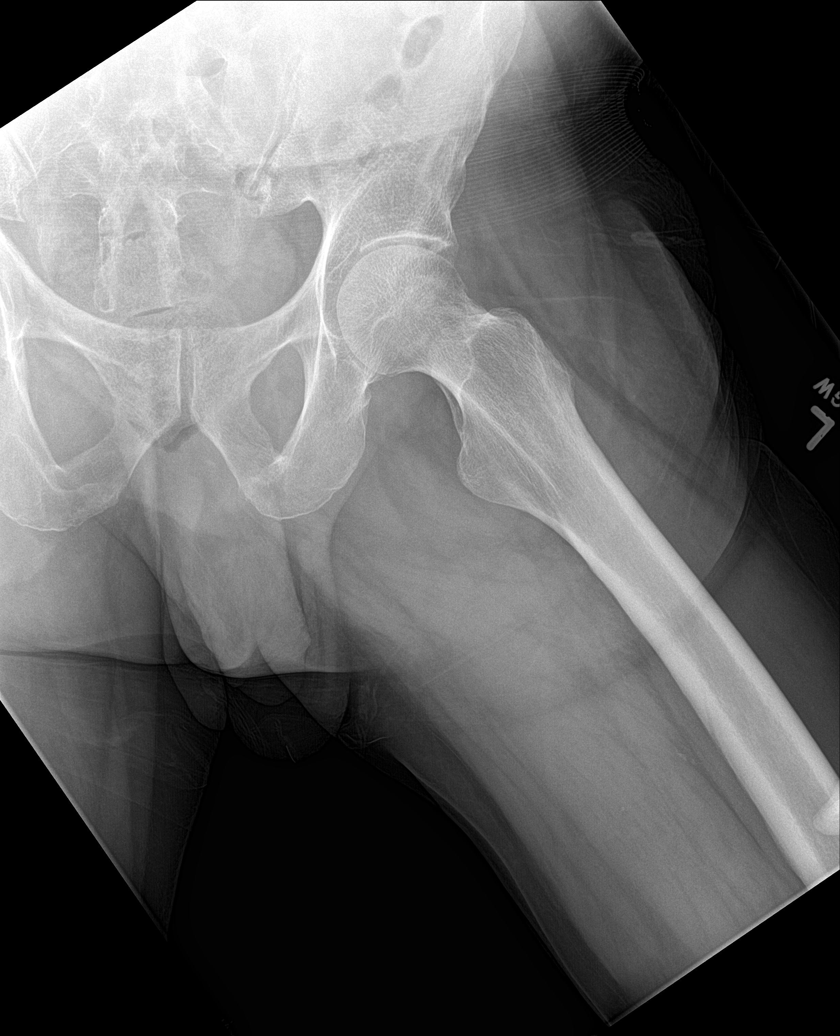

[hip lat (2 of 2)]
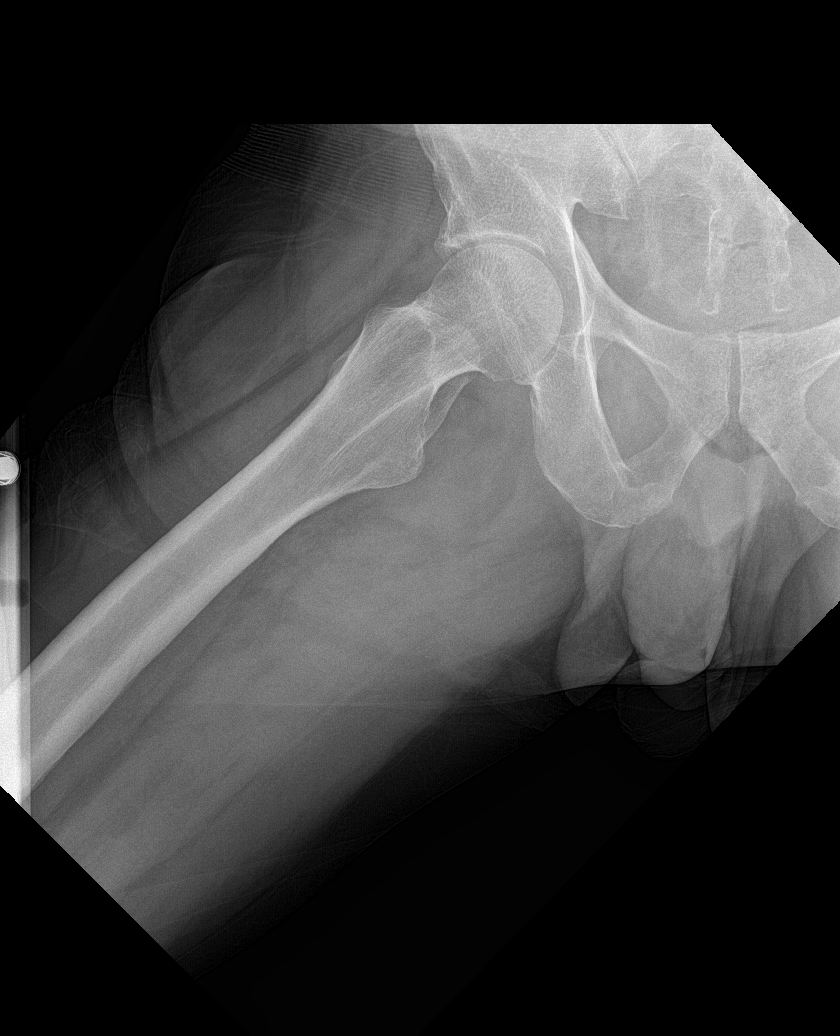

[5 of 5 positions shown; findings below may reference images not displayed]

FINDINGS: No acute bony or joint abnormality. Mild degenerative change. Mild
osteopenia.
IMPRESSION: No acute abnormality. Mild degenerative change and osteopenia both
hips.

## 2015-09-14 ENCOUNTER — Ambulatory Visit
Admission: RE | Admit: 2015-09-14 | Discharge: 2015-09-14 | Disposition: A | Discharge status: Home / Self Care | Payer: 59 | Source: Ambulatory Visit | Attending: Nurse Practitioner | Admitting: Nurse Practitioner

## 2015-09-14 DIAGNOSIS — K7460 Unspecified cirrhosis of liver: Secondary | ICD-10-CM

## 2016-01-23 ENCOUNTER — Encounter (HOSPITAL_COMMUNITY): Payer: Self-pay | Admitting: Emergency Medicine

## 2016-01-23 ENCOUNTER — Emergency Department (HOSPITAL_COMMUNITY)
Admission: EM | Admit: 2016-01-23 | Discharge: 2016-01-23 | Disposition: A | Discharge status: Home / Self Care | Payer: PRIVATE HEALTH INSURANCE | Attending: Emergency Medicine | Admitting: Emergency Medicine

## 2016-01-23 ENCOUNTER — Emergency Department (HOSPITAL_COMMUNITY): Payer: PRIVATE HEALTH INSURANCE

## 2016-01-23 DIAGNOSIS — M25562 Pain in left knee: Secondary | ICD-10-CM | POA: Insufficient documentation

## 2016-01-23 DIAGNOSIS — G8929 Other chronic pain: Secondary | ICD-10-CM | POA: Insufficient documentation

## 2016-01-23 DIAGNOSIS — I10 Essential (primary) hypertension: Secondary | ICD-10-CM | POA: Diagnosis not present

## 2016-01-23 DIAGNOSIS — E010 Iodine-deficiency related diffuse (endemic) goiter: Secondary | ICD-10-CM | POA: Insufficient documentation

## 2016-01-23 MED ORDER — OXYCODONE HCL 5 MG PO TABS: 5.0000 mg | ORAL_TABLET | Freq: Four times a day (QID) | ORAL | 0 refills | Status: DC | PRN

## 2016-01-23 NOTE — Discharge Instructions (Signed)
Take the pain medication is prescribed for bad pain. Applying a heating pad or moist heat. Knee 4 times daily for 30 minutes at a time also help with discomfort. Your blood pressure is elevated today at 158/120. It should be rechecked at Dr. Oralia Rud office within a week. If you need refills for pain medication or further evaluation of your knee contact Dr. Katherine Roan or [place]

## 2016-01-23 NOTE — ED Provider Notes (Signed)
Derrick Lawson Provider Note   CSN: [id number] Arrival date & time: 01/23/16  0847     History   Chief Complaint Chief Complaint  Patient presents with  . Joint Swelling    HPI Derrick Lawson is a 65 y.o. male.Complains of left knee pain for several months becoming worse over the past 1 month. Been seen by [facility] and by his primary care physician Dr. Katherine Lawson for same complaint and prescribed. He's reports that he's had his knee tapped in the past and been told he had arthritis. Derrick Lawson He is treated himself with over-the-counter pain medication without adequate pain relief. An worse with walking improved with rest. No fever no trauma no other associated symptoms HPI  Past Medical History:  Diagnosis Date  . Gout   . Hypertension   Hepatitis C  Patient Active Problem List   Diagnosis Date Noted  . Post-operative state 08/02/2013  . Right inguinal hernia 06/14/2013    Past Surgical History:  Procedure Laterality Date  . INGUINAL HERNIA REPAIR Right 07/11/2013   Procedure: RIGHT HERNIA REPAIR INGUINAL ADULT;  Surgeon: Derrick Faster. Cornett, MD;  Location: [place];  Service: General;  Laterality: Right;  . INSERTION OF MESH Right 07/11/2013   Procedure: INSERTION OF MESH;  Surgeon: Derrick Faster. Cornett, MD;  Location: [place];  Service: General;  Laterality: Right;       Home Medications    Prior to Admission medications   Medication Sig Start Date End Date Taking? Authorizing Provider  indomethacin (INDOCIN) 25 MG capsule Take 1 capsule (25 mg total) by mouth 3 (three) times daily as needed. 10/30/14   Derrick Docker, NP  methylPREDNISolone (MEDROL DOSEPAK) 4 MG TBPK tablet follow package directions 08/21/14   Wallene Huh, DPM  naproxen sodium (ALEVE) 220 MG tablet Take 1 tablet (220 mg total) by mouth 2 (two) times daily with a meal. 06/07/14   Derrick Grit, MD  Olmesartan-Amlodipine-HCTZ [facility]) 40-10-25 MG TABS Take 1  tablet by mouth daily.    Historical Provider, MD  oxyCODONE-acetaminophen (PERCOCET/ROXICET) 5-325 MG per tablet Take 2 tablets by mouth every 4 (four) hours as needed for severe pain. 10/30/14   Derrick Docker, NP  Vitamin D, Ergocalciferol, (DRISDOL) 50000 UNITS CAPS capsule Take 50,000 Units by mouth once a week. 06/05/14   Historical Provider, MD    Family History History reviewed. No pertinent family history.  Social History Social History  Substance Use Topics  . Smoking status: Never Smoker  . Smokeless tobacco: Never Used  . Alcohol use No     Allergies   Patient has no known allergies.   Review of Systems Review of Systems  Constitutional: Negative.   Musculoskeletal: Positive for arthralgias.       Left knee pain  Neurological: Negative.      Physical Exam Updated Vital Signs BP (!) 132/102 (BP Location: Left Arm)   Pulse 85   Temp 98 F (36.7 C) (Oral)   Resp 18   Ht 5\' 9"  (1.753 m)   Wt 170 lb (77.1 kg)   SpO2 100%   BMI 25.10 kg/m   Physical Exam  Constitutional: He is oriented to person, place, and time. He appears well-developed and well-nourished.  HENT:  Head: Normocephalic and atraumatic.  Right Ear: External ear normal.  Left Ear: External ear normal.  Eyes: EOM are normal.  Neck: Thyromegaly present.  Cardiovascular: Normal rate.   Pulmonary/Chest: Effort normal.  Abdominal: He exhibits no distension.  Musculoskeletal:  Left lower extremity without redness swelling or point tenderness. Specifically clinically there is no effusion at knee. DP pulse 2+ good capillary refill. All extremities without redness or tenderness neurovascular intact  Neurological: He is oriented to person, place, and time.  Gait normal  Nursing note and vitals reviewed.    ED Treatments / Results  Labs (all labs ordered are listed, but only abnormal results are displayed) Labs Reviewed - No data to display  EKG  EKG Interpretation None        Radiology Dg Knee Complete 4 Views Left  Result Date: 01/23/2016 CLINICAL DATA:  Knee pain for 1 month, no known injury, initial encounter EXAM: LEFT KNEE - COMPLETE 4+ VIEW COMPARISON:  05/26/2009 FINDINGS: No acute fracture or dislocation is identified. Minimal joint effusion is seen. There is some separation of previously noted tibial tubercle ossification Lawson. Correlation to point tenderness is recommended. No other focal abnormality is seen. IMPRESSION: Mild separation of previously seen tibial tubercle. Correlation to point tenderness is recommended. No other focal abnormality is seen. Electronically Signed   By: Inez Catalina M.D.   On: 01/23/2016 09:55    Procedures Procedures (including critical care time)  Medications Ordered in ED Medications - No data to display   Initial Impression / Assessment and Plan / ED Course  I have reviewed the triage vital signs and the nursing notes.  Pertinent labs & imaging results that were available during my care of the patient were reviewed by me and considered in my medical decision making (see chart for details).  Clinical Course     X-ray viewed by me. Patient will be prescribed a brief course of OxyIR in that he has hypertension, relative contraindication to NSAIDs and history of hepatitis C which is relative contraindication to acetaminophen. Blood pressure recheck one week. [place] controlled substances reporting systemqueried. Follow-up with Dr. Katherine Lawson. I suspect the patient suffers from general arthritis given x-ray findings Results for orders placed or performed during the hospital encounter of 07/11/13  CBC WITH DIFFERENTIAL  Result Value Ref Range   WBC 6.6 4.0 - 10.5 K/uL   RBC 4.01 (L) 4.22 - 5.81 MIL/uL   Hemoglobin 12.5 (L) 13.0 - 17.0 g/dL   HCT 37.5 (L) 39.0 - 52.0 %   MCV 93.5 78.0 - 100.0 fL   MCH 31.2 26.0 - 34.0 pg   MCHC 33.3 30.0 - 36.0 g/dL   RDW 12.9 11.5 - 15.5 %   Platelets 175 150 - 400 K/uL    Neutrophils Relative % 63 43 - 77 %   Neutro Abs 4.2 1.7 - 7.7 K/uL   Lymphocytes Relative 26 12 - 46 %   Lymphs Abs 1.7 0.7 - 4.0 K/uL   Monocytes Relative 9 3 - 12 %   Monocytes Absolute 0.6 0.1 - 1.0 K/uL   Eosinophils Relative 2 0 - 5 %   Eosinophils Absolute 0.1 0.0 - 0.7 K/uL   Basophils Relative 0 0 - 1 %   Basophils Absolute 0.0 0.0 - 0.1 K/uL  Comprehensive metabolic panel  Result Value Ref Range   Sodium 142 137 - 147 mEq/L   Potassium 4.8 3.7 - 5.3 mEq/L   Chloride 105 96 - 112 mEq/L   CO2 24 19 - 32 mEq/L   Glucose, Bld 98 70 - 99 mg/dL   BUN 27 (H) 6 - 23 mg/dL   Creatinine, Ser 1.48 (H) 0.50 - 1.35 mg/dL   Calcium 9.4 8.4 - 10.5 mg/dL  Total Protein 7.9 6.0 - 8.3 g/dL   Albumin 4.0 3.5 - 5.2 g/dL   AST 37 0 - 37 U/L   ALT 31 0 - 53 U/L   Alkaline Phosphatase 82 39 - 117 U/L   Total Bilirubin 0.5 0.3 - 1.2 mg/dL   GFR calc non Af Amer 49 (L) >90 mL/min   GFR calc Af Amer 57 (L) >90 mL/min   Dg Knee Complete 4 Views Left  Result Date: 01/23/2016 CLINICAL DATA:  Knee pain for 1 month, no known injury, initial encounter EXAM: LEFT KNEE - COMPLETE 4+ VIEW COMPARISON:  05/26/2009 FINDINGS: No acute fracture or dislocation is identified. Minimal joint effusion is seen. There is some separation of previously noted tibial tubercle ossification Lawson. Correlation to point tenderness is recommended. No other focal abnormality is seen. IMPRESSION: Mild separation of previously seen tibial tubercle. Correlation to point tenderness is recommended. No other focal abnormality is seen. Electronically Signed   By: Inez Catalina M.D.   On: 01/23/2016 09:55   Final Clinical Impressions(s) / ED Diagnoses  Dx #1 left knee pain #2 elevated blood pressure Final diagnoses:  None    New Prescriptions New Prescriptions   No medications on file     [name], MD 01/23/16 1027

## 2016-01-23 NOTE — ED Triage Notes (Addendum)
States he has left knee pain for at least a month but has chosen not to see ortho MD. Knee has has been xrayed in past and was told he has arthritis ( 1 year ago). Gave him braces to put on but he cant find them. Knee has visible swelling. States it seems to have gotten worse with cold weather. No other distress noted. Takes meds for gout.

## 2016-01-23 NOTE — ED Notes (Signed)
Papers and medications reviewed with patient and he verbalizes understanding. 

## 2016-04-06 ENCOUNTER — Other Ambulatory Visit: Payer: Self-pay | Admitting: Nurse Practitioner

## 2016-04-06 DIAGNOSIS — K7469 Other cirrhosis of liver: Secondary | ICD-10-CM

## 2016-09-22 ENCOUNTER — Ambulatory Visit
Admission: RE | Admit: 2016-09-22 | Discharge: 2016-09-22 | Disposition: A | Discharge status: Home / Self Care | Payer: 59 | Source: Ambulatory Visit | Attending: Nurse Practitioner | Admitting: Nurse Practitioner

## 2016-09-22 DIAGNOSIS — K7469 Other cirrhosis of liver: Secondary | ICD-10-CM

## 2016-12-01 ENCOUNTER — Encounter: Payer: Self-pay | Admitting: Nurse Practitioner

## 2016-12-02 ENCOUNTER — Encounter: Payer: Self-pay | Admitting: Gastroenterology

## 2017-03-22 ENCOUNTER — Other Ambulatory Visit: Payer: Self-pay | Admitting: Nurse Practitioner

## 2017-03-22 DIAGNOSIS — K7469 Other cirrhosis of liver: Secondary | ICD-10-CM

## 2017-05-04 ENCOUNTER — Emergency Department (HOSPITAL_COMMUNITY)
Admission: EM | Admit: 2017-05-04 | Discharge: 2017-05-04 | Disposition: A | Discharge status: Home / Self Care | Payer: 59 | Attending: Emergency Medicine | Admitting: Emergency Medicine

## 2017-05-04 ENCOUNTER — Emergency Department (HOSPITAL_COMMUNITY): Payer: 59

## 2017-05-04 ENCOUNTER — Encounter (HOSPITAL_COMMUNITY): Payer: Self-pay | Admitting: Emergency Medicine

## 2017-05-04 DIAGNOSIS — Z79899 Other long term (current) drug therapy: Secondary | ICD-10-CM | POA: Insufficient documentation

## 2017-05-04 DIAGNOSIS — M1A0220 Idiopathic chronic gout, left elbow, without tophus (tophi): Secondary | ICD-10-CM | POA: Insufficient documentation

## 2017-05-04 DIAGNOSIS — M1000 Idiopathic gout, unspecified site: Secondary | ICD-10-CM

## 2017-05-04 DIAGNOSIS — M25521 Pain in right elbow: Secondary | ICD-10-CM | POA: Diagnosis present

## 2017-05-04 LAB — CBC WITH DIFFERENTIAL/PLATELET
Basophils Absolute: 0 10*3/uL (ref 0.0–0.1)
Basophils Relative: 0 %
EOS PCT: 1 %
Eosinophils Absolute: 0.1 10*3/uL (ref 0.0–0.7)
HCT: 37.2 % — ABNORMAL LOW (ref 39.0–52.0)
Hemoglobin: 12.2 g/dL — ABNORMAL LOW (ref 13.0–17.0)
LYMPHS ABS: 1.4 10*3/uL (ref 0.7–4.0)
LYMPHS PCT: 18 %
MCH: 30.6 pg (ref 26.0–34.0)
MCHC: 32.8 g/dL (ref 30.0–36.0)
MCV: 93.2 fL (ref 78.0–100.0)
MONO ABS: 0.6 10*3/uL (ref 0.1–1.0)
Monocytes Relative: 8 %
Neutro Abs: 6 10*3/uL (ref 1.7–7.7)
Neutrophils Relative %: 73 %
PLATELETS: 164 10*3/uL (ref 150–400)
RBC: 3.99 MIL/uL — ABNORMAL LOW (ref 4.22–5.81)
RDW: 12.8 % (ref 11.5–15.5)
WBC: 8.1 10*3/uL (ref 4.0–10.5)

## 2017-05-04 LAB — COMPREHENSIVE METABOLIC PANEL
ALT: 11 U/L — ABNORMAL LOW (ref 17–63)
AST: 23 U/L (ref 15–41)
Albumin: 3.9 g/dL (ref 3.5–5.0)
Alkaline Phosphatase: 78 U/L (ref 38–126)
Anion gap: 9 (ref 5–15)
BUN: 21 mg/dL — ABNORMAL HIGH (ref 6–20)
CHLORIDE: 107 mmol/L (ref 101–111)
CO2: 27 mmol/L (ref 22–32)
Calcium: 9.6 mg/dL (ref 8.9–10.3)
Creatinine, Ser: 1.46 mg/dL — ABNORMAL HIGH (ref 0.61–1.24)
GFR calc Af Amer: 56 mL/min — ABNORMAL LOW (ref >60)
GFR, EST NON AFRICAN AMERICAN: 49 mL/min — AB (ref >60)
Glucose, Bld: 96 mg/dL (ref 65–99)
Potassium: 4.2 mmol/L (ref 3.5–5.1)
Sodium: 143 mmol/L (ref 135–145)
Total Bilirubin: 0.5 mg/dL (ref 0.3–1.2)
Total Protein: 8.4 g/dL — ABNORMAL HIGH (ref 6.5–8.1)

## 2017-05-04 LAB — URIC ACID: URIC ACID, SERUM: 10.6 mg/dL — AB (ref 4.4–7.6)

## 2017-05-04 MED ORDER — PREDNISONE 10 MG PO TABS: 20.0000 mg | ORAL_TABLET | Freq: Every day | ORAL | 0 refills | Status: DC

## 2017-05-04 MED ORDER — HYDROMORPHONE HCL 1 MG/ML IJ SOLN
0.5000 mg | Freq: Once | INTRAMUSCULAR | Status: AC
  Administered 2017-05-04: 0.5 mg via INTRAVENOUS
  Filled 2017-05-04: qty 1

## 2017-05-04 MED ORDER — METHYLPREDNISOLONE SODIUM SUCC 125 MG IJ SOLR
125.0000 mg | Freq: Once | INTRAMUSCULAR | Status: AC
  Administered 2017-05-04: 125 mg via INTRAVENOUS
  Filled 2017-05-04: qty 2

## 2017-05-04 MED ORDER — NEBIVOLOL HCL 10 MG PO TABS
10.0000 mg | ORAL_TABLET | Freq: Every day | ORAL | Status: DC
  Administered 2017-05-04: 10 mg via ORAL
  Filled 2017-05-04: qty 1

## 2017-05-04 MED ORDER — OXYCODONE-ACETAMINOPHEN 5-325 MG PO TABS: 2.0000 | ORAL_TABLET | ORAL | 0 refills | Status: DC | PRN

## 2017-05-04 MED ORDER — ONDANSETRON HCL 4 MG/2ML IJ SOLN
4.0000 mg | Freq: Once | INTRAMUSCULAR | Status: AC
Start: 2017-05-04 — End: 2017-05-04
  Administered 2017-05-04: 4 mg via INTRAVENOUS
  Filled 2017-05-04: qty 2

## 2017-05-04 NOTE — ED Triage Notes (Signed)
patient c/o left elbow pain and swelling that radiates down left forearm to hand since Sunday. Patient has left hand swelling. Has Hx gout.

## 2017-05-04 NOTE — ED Notes (Signed)
Patient transported to X-ray 

## 2017-05-04 NOTE — Discharge Instructions (Signed)
Follow-up with Dr. Maureen Ralphs or one of his partners.  Get a family md and follow up in 2-3 weeks

## 2017-05-04 NOTE — ED Provider Notes (Signed)
[facility] DEPT Provider Note   CSN: [id number] Arrival date & time: 05/04/17  1058     History   Chief Complaint Chief Complaint  Patient presents with  . Joint Swelling  . Hand Pain  . Arm Pain    HPI Derrick Lawson is a 67 y.o. male.  Patient complains of severe left elbow pain.  He has swelling also.  Patient has a history of gout and has not been taking his medicines   The history is provided by the patient. No language interpreter was used.  Arm Pain  This is a recurrent problem. The current episode started more than 2 days ago. The problem occurs constantly. The problem has not changed since onset.Pertinent negatives include no chest pain, no abdominal pain and no headaches. The symptoms are aggravated by twisting. Nothing relieves the symptoms.    Past Medical History:  Diagnosis Date  . Gout   . Hypertension     Patient Active Problem List   Diagnosis Date Noted  . Post-operative state 08/02/2013  . Right inguinal hernia 06/14/2013    Past Surgical History:  Procedure Laterality Date  . INGUINAL HERNIA REPAIR Right 07/11/2013   Procedure: RIGHT HERNIA REPAIR INGUINAL ADULT;  Surgeon: Joyice Faster. Cornett, MD;  Location: [facility];  Service: General;  Laterality: Right;  . INSERTION OF MESH Right 07/11/2013   Procedure: INSERTION OF MESH;  Surgeon: Joyice Faster. Cornett, MD;  Location: [facility];  Service: General;  Laterality: Right;       Home Medications    Prior to Admission medications   Medication Sig Start Date End Date Taking? Authorizing Provider  nebivolol (BYSTOLIC) 10 MG tablet Take 10 mg by mouth daily.   Yes [provider]  indomethacin (INDOCIN) 25 MG capsule Take 1 capsule (25 mg total) by mouth 3 (three) times daily as needed. Patient not taking: Reported on 05/04/2017 10/30/14   [name], NP  methylPREDNISolone (MEDROL DOSEPAK) 4 MG TBPK tablet follow package  directions Patient not taking: Reported on 05/04/2017 08/21/14   Wallene Huh, DPM  naproxen sodium (ALEVE) 220 MG tablet Take 1 tablet (220 mg total) by mouth 2 (two) times daily with a meal. Patient not taking: Reported on 05/04/2017 06/07/14   [name], MD  oxyCODONE (ROXICODONE) 5 MG immediate release tablet Take 1 tablet (5 mg total) by mouth every 6 (six) hours as needed for severe pain. Patient not taking: Reported on 05/04/2017 01/23/16   [name], MD  oxyCODONE-acetaminophen (PERCOCET) 5-325 MG tablet Take 2 tablets by mouth every 4 (four) hours as needed. 05/04/17   [name], MD  predniSONE (DELTASONE) 10 MG tablet Take 2 tablets (20 mg total) by mouth daily. 05/04/17   [name], MD  colchicine 0.6 MG tablet 2 tabs po x 1, then one tab po 1 hour later Patient not taking: Reported on 06/07/2014 10/11/13 10/30/14  Melony Overly, MD    Family History No family history on file.  Social History Social History   Tobacco Use  . Smoking status: Never Smoker  . Smokeless tobacco: Never Used  Substance Use Topics  . Alcohol use: No  . Drug use: No     Allergies   Patient has no known allergies.   Review of Systems Review of Systems  Constitutional: Negative for appetite change and fatigue.  HENT: Negative for congestion, ear discharge and sinus pressure.   Eyes: Negative for discharge.  Respiratory: Negative  for cough.   Cardiovascular: Negative for chest pain.  Gastrointestinal: Negative for abdominal pain and diarrhea.  Genitourinary: Negative for frequency and hematuria.  Musculoskeletal: Negative for back pain.       Swelling and pain to left elbow  Skin: Negative for rash.  Neurological: Negative for seizures and headaches.  Psychiatric/Behavioral: Negative for hallucinations.     Physical Exam Updated Vital Signs BP (!) 191/121 (BP Location: Right Arm)   Pulse (!) 57   Temp 97.7 F (36.5 C) (Oral)   Resp 18   SpO2 99%   Physical Exam    Constitutional: He is oriented to person, place, and time. He appears well-developed.  HENT:  Head: Normocephalic.  Eyes: Conjunctivae are normal.  Neck: No tracheal deviation present.  Cardiovascular:  No murmur heard. Musculoskeletal: Normal range of motion.  Severe swelling and tenderness to left elbow neurovascular exam normal decreased range of motion  Neurological: He is oriented to person, place, and time.  Skin: Skin is warm.  Psychiatric: He has a normal mood and affect.     ED Treatments / Results  Labs (all labs ordered are listed, but only abnormal results are displayed) Labs Reviewed  CBC WITH DIFFERENTIAL/PLATELET - Abnormal; Notable for the following components:      Result Value   RBC 3.99 (*)    Hemoglobin 12.2 (*)    HCT 37.2 (*)    All other components within normal limits  COMPREHENSIVE METABOLIC PANEL - Abnormal; Notable for the following components:   BUN 21 (*)    Creatinine, Ser 1.46 (*)    Total Protein 8.4 (*)    ALT 11 (*)    GFR calc non Af Amer 49 (*)    GFR calc Af Amer 56 (*)    All other components within normal limits  URIC ACID - Abnormal; Notable for the following components:   Uric Acid, Serum 10.6 (*)    All other components within normal limits    EKG  EKG Interpretation None       Radiology Dg Elbow Complete Left  Result Date: 05/04/2017 CLINICAL DATA:  Acute onset of left elbow pain and swelling 4 days ago. No recent injuries. Current history of gout. EXAM: LEFT ELBOW - COMPLETE 3+ VIEW COMPARISON:  None. FINDINGS: No evidence of acute or subacute fracture or dislocation. Well-preserved joint spaces. Anterior and posterior fat pads are noted on the lateral image indicating a joint effusion. Well-preserved bone mineral density. No intrinsic osseous abnormalities. IMPRESSION: 1. No osseous abnormality. 2. Large joint effusion, possibly related to the patient's known gout. Electronically Signed   By: Evangeline Dakin M.D.   On:  05/04/2017 15:48    Procedures Procedures (including critical care time)  Medications Ordered in ED Medications  nebivolol (BYSTOLIC) tablet 10 mg (not administered)  methylPREDNISolone sodium succinate (SOLU-MEDROL) 125 mg/2 mL injection 125 mg (125 mg Intravenous Given 05/04/17 1526)  HYDROmorphone (DILAUDID) injection 0.5 mg (0.5 mg Intravenous Given 05/04/17 1526)  ondansetron (ZOFRAN) injection 4 mg (4 mg Intravenous Given 05/04/17 1526)     Initial Impression / Assessment and Plan / ED Course  I have reviewed the triage vital signs and the nursing notes.  Pertinent labs & imaging results that were available during my care of the patient were reviewed by me and considered in my medical decision making (see chart for details).    Labs and x-rays reviewed.  Patient with elevated uric acid.  Symptoms consistent with gout.  He will be  placed on prednisone and given Percocet and referred to orthopedics and is told he needs to follow-up with his family doctor.  Patient also given his daily dose of blood pressure medicine because he had not taken it today  Final Clinical Impressions(s) / ED Diagnoses   Final diagnoses:  None    ED Discharge Orders        Ordered    oxyCODONE-acetaminophen (PERCOCET) 5-325 MG tablet  Every 4 hours PRN     05/04/17 1613    predniSONE (DELTASONE) 10 MG tablet  Daily     05/04/17 1613       [name], MD 05/04/17 1619

## 2017-07-03 ENCOUNTER — Encounter (HOSPITAL_COMMUNITY): Payer: Self-pay | Admitting: Family Medicine

## 2017-07-03 ENCOUNTER — Ambulatory Visit (HOSPITAL_COMMUNITY)
Admission: EM | Admit: 2017-07-03 | Discharge: 2017-07-03 | Disposition: A | Discharge status: Home / Self Care | Payer: 59 | Attending: Family Medicine | Admitting: Family Medicine

## 2017-07-03 DIAGNOSIS — M25462 Effusion, left knee: Secondary | ICD-10-CM | POA: Diagnosis not present

## 2017-07-03 MED ORDER — METHYLPREDNISOLONE ACETATE 80 MG/ML IJ SUSP
INTRAMUSCULAR | Status: AC
  Filled 2017-07-03: qty 1

## 2017-07-03 MED ORDER — BUPIVACAINE HCL (PF) 0.5 % IJ SOLN
INTRAMUSCULAR | Status: AC
  Filled 2017-07-03: qty 10

## 2017-07-03 MED ORDER — PREDNISONE 10 MG PO TABS: 20.0000 mg | ORAL_TABLET | Freq: Every day | ORAL | 0 refills | Status: DC

## 2017-07-03 NOTE — ED Triage Notes (Signed)
Pt here for left knee pain and swelling since last thursday, denies any injury.

## 2017-07-03 NOTE — ED Provider Notes (Signed)
[facility]    CSN: [id number] Arrival date & time: 07/03/17  1516     History   Chief Complaint Chief Complaint  Patient presents with  . Knee Pain    HPI Derrick Lawson is a 67 y.o. male.   Complaining of left knee pain.  This has been a chronic issue.  Noticed that he had x-ray about 1 year ago that showed minimal degenerative changes.  He has a history of gout having affected many joints but not the knee that is far as he knows.  The knee has not felt warm to him or looked red but it is swollen.  HPI  Past Medical History:  Diagnosis Date  . Gout   . Hypertension     Patient Active Problem List   Diagnosis Date Noted  . Post-operative state 08/02/2013  . Right inguinal hernia 06/14/2013    Past Surgical History:  Procedure Laterality Date  . INGUINAL HERNIA REPAIR Right 07/11/2013   Procedure: RIGHT HERNIA REPAIR INGUINAL ADULT;  Surgeon: Joyice Faster. Cornett, MD;  Location: [facility];  Service: General;  Laterality: Right;  . INSERTION OF MESH Right 07/11/2013   Procedure: INSERTION OF MESH;  Surgeon: Joyice Faster. Cornett, MD;  Location: [facility];  Service: General;  Laterality: Right;       Home Medications    Prior to Admission medications   Medication Sig Start Date End Date Taking? Authorizing Provider  indomethacin (INDOCIN) 25 MG capsule Take 1 capsule (25 mg total) by mouth 3 (three) times daily as needed. Patient not taking: Reported on 05/04/2017 10/30/14   [name], NP  methylPREDNISolone (MEDROL DOSEPAK) 4 MG TBPK tablet follow package directions Patient not taking: Reported on 05/04/2017 08/21/14   Wallene Huh, DPM  naproxen sodium (ALEVE) 220 MG tablet Take 1 tablet (220 mg total) by mouth 2 (two) times daily with a meal. Patient not taking: Reported on 05/04/2017 06/07/14   [name], MD  nebivolol (BYSTOLIC) 10 MG tablet Take 10 mg by mouth daily.    [provider]  oxyCODONE (ROXICODONE) 5  MG immediate release tablet Take 1 tablet (5 mg total) by mouth every 6 (six) hours as needed for severe pain. Patient not taking: Reported on 05/04/2017 01/23/16   [name], MD  oxyCODONE-acetaminophen (PERCOCET) 5-325 MG tablet Take 2 tablets by mouth every 4 (four) hours as needed. 05/04/17   [name], MD  predniSONE (DELTASONE) 10 MG tablet Take 2 tablets (20 mg total) by mouth daily. 05/04/17   [name], MD    Family History History reviewed. No pertinent family history.  Social History Social History   Tobacco Use  . Smoking status: Never Smoker  . Smokeless tobacco: Never Used  Substance Use Topics  . Alcohol use: No  . Drug use: No     Allergies   Patient has no known allergies.   Review of Systems Review of Systems  Constitutional: Negative for chills and fever.  HENT: Negative for ear pain and sore throat.   Eyes: Negative for pain and visual disturbance.  Respiratory: Negative for cough and shortness of breath.   Cardiovascular: Negative for chest pain and palpitations.  Gastrointestinal: Negative for abdominal pain and vomiting.  Genitourinary: Negative for dysuria and hematuria.  Musculoskeletal: Positive for joint swelling. Negative for arthralgias and back pain.  Skin: Negative for color change and rash.  Neurological: Negative for seizures and syncope.  All other systems reviewed and are  negative.    Physical Exam Triage Vital Signs ED Triage Vitals  Enc Vitals Group     BP 07/03/17 1557 (!) 145/106     Pulse Rate 07/03/17 1557 72     Resp 07/03/17 1557 18     Temp 07/03/17 1557 98.7 F (37.1 C)     Temp src --      SpO2 07/03/17 1557 100 %     Weight --      Height --      Head Circumference --      Peak Flow --      Pain Score 07/03/17 1556 3     Pain Loc --      Pain Edu? --      Excl. in [place]? --    No data found.  Updated Vital Signs BP (!) 145/106   Pulse 72   Temp 98.7 F (37.1 C)   Resp 18   SpO2 100%   Visual  Acuity Right Eye Distance:   Left Eye Distance:   Bilateral Distance:    Right Eye Near:   Left Eye Near:    Bilateral Near:     Physical Exam  Musculoskeletal:  Left knee: There is effusion present.  Skin is cool and not erythematous.  Knee is stable to stress testing.  Joint aspiration was attempted x2 with no fluid.     UC Treatments / Results  Labs (all labs ordered are listed, but only abnormal results are displayed) Labs Reviewed - No data to display  EKG None  Radiology No results found.  Procedures Procedures (including critical care time)  Medications Ordered in UC Medications - No data to display  Initial Impression / Assessment and Plan / UC Course  I have reviewed the triage vital signs and the nursing notes.  Pertinent labs & imaging results that were available during my care of the patient were reviewed by me and considered in my medical decision making (see chart for details).     Knee pain and effusion.  Suspect degenerative arthritis.  Another possibility would be gout but less likely without erythema and increased temp. We will treat with prednisone for 5 days. Final Clinical Impressions(s) / UC Diagnoses   Final diagnoses:  None   Discharge Instructions   None    ED Prescriptions    None     Controlled Substance Prescriptions [place] Controlled Substance Registry consulted? No   Wardell Honour, MD 07/03/17 [phone]

## 2017-08-21 DIAGNOSIS — I10 Essential (primary) hypertension: Secondary | ICD-10-CM | POA: Insufficient documentation

## 2017-08-21 DIAGNOSIS — Z8619 Personal history of other infectious and parasitic diseases: Secondary | ICD-10-CM | POA: Insufficient documentation

## 2017-09-13 ENCOUNTER — Ambulatory Visit
Admission: RE | Admit: 2017-09-13 | Discharge: 2017-09-13 | Disposition: A | Discharge status: Home / Self Care | Payer: 59 | Source: Ambulatory Visit | Attending: Nurse Practitioner | Admitting: Nurse Practitioner

## 2017-09-13 DIAGNOSIS — K7469 Other cirrhosis of liver: Secondary | ICD-10-CM

## 2017-09-19 DIAGNOSIS — N183 Chronic kidney disease, stage 3 unspecified: Secondary | ICD-10-CM | POA: Insufficient documentation

## 2017-10-24 DIAGNOSIS — K766 Portal hypertension: Secondary | ICD-10-CM | POA: Insufficient documentation

## 2017-10-27 DIAGNOSIS — E782 Mixed hyperlipidemia: Secondary | ICD-10-CM | POA: Insufficient documentation

## 2017-11-02 DIAGNOSIS — R972 Elevated prostate specific antigen [PSA]: Secondary | ICD-10-CM | POA: Insufficient documentation

## 2018-01-18 ENCOUNTER — Emergency Department (HOSPITAL_COMMUNITY): Payer: Medicare HMO

## 2018-01-18 ENCOUNTER — Encounter (HOSPITAL_COMMUNITY): Payer: Self-pay

## 2018-01-18 ENCOUNTER — Other Ambulatory Visit: Payer: Self-pay

## 2018-01-18 ENCOUNTER — Emergency Department (HOSPITAL_COMMUNITY)
Admission: EM | Admit: 2018-01-18 | Discharge: 2018-01-18 | Disposition: A | Discharge status: Home / Self Care | Payer: Medicare HMO | Attending: Emergency Medicine | Admitting: Emergency Medicine

## 2018-01-18 DIAGNOSIS — M5430 Sciatica, unspecified side: Secondary | ICD-10-CM | POA: Diagnosis not present

## 2018-01-18 DIAGNOSIS — Z79899 Other long term (current) drug therapy: Secondary | ICD-10-CM | POA: Diagnosis not present

## 2018-01-18 DIAGNOSIS — M109 Gout, unspecified: Secondary | ICD-10-CM

## 2018-01-18 DIAGNOSIS — I10 Essential (primary) hypertension: Secondary | ICD-10-CM | POA: Insufficient documentation

## 2018-01-18 DIAGNOSIS — M1712 Unilateral primary osteoarthritis, left knee: Secondary | ICD-10-CM | POA: Diagnosis not present

## 2018-01-18 HISTORY — DX: Unspecified osteoarthritis, unspecified site: M19.90

## 2018-01-18 LAB — CBC WITH DIFFERENTIAL/PLATELET
ABS IMMATURE GRANULOCYTES: 0.03 10*3/uL (ref 0.00–0.07)
Basophils Absolute: 0 10*3/uL (ref 0.0–0.1)
Basophils Relative: 0 %
Eosinophils Absolute: 0.1 10*3/uL (ref 0.0–0.5)
Eosinophils Relative: 1 %
HEMATOCRIT: 37.8 % — AB (ref 39.0–52.0)
Hemoglobin: 12 g/dL — ABNORMAL LOW (ref 13.0–17.0)
IMMATURE GRANULOCYTES: 0 %
LYMPHS ABS: 1.6 10*3/uL (ref 0.7–4.0)
LYMPHS PCT: 21 %
MCH: 30.7 pg (ref 26.0–34.0)
MCHC: 31.7 g/dL (ref 30.0–36.0)
MCV: 96.7 fL (ref 80.0–100.0)
MONO ABS: 0.5 10*3/uL (ref 0.1–1.0)
MONOS PCT: 7 %
NEUTROS ABS: 5.4 10*3/uL (ref 1.7–7.7)
Neutrophils Relative %: 71 %
Platelets: 185 10*3/uL (ref 150–400)
RBC: 3.91 MIL/uL — ABNORMAL LOW (ref 4.22–5.81)
RDW: 13.2 % (ref 11.5–15.5)
WBC: 7.6 10*3/uL (ref 4.0–10.5)
nRBC: 0 % (ref 0.0–0.2)

## 2018-01-18 LAB — COMPREHENSIVE METABOLIC PANEL
ALBUMIN: 4.1 g/dL (ref 3.5–5.0)
ALT: 18 U/L (ref 0–44)
AST: 22 U/L (ref 15–41)
Alkaline Phosphatase: 72 U/L (ref 38–126)
Anion gap: 11 (ref 5–15)
BILIRUBIN TOTAL: 0.7 mg/dL (ref 0.3–1.2)
BUN: 33 mg/dL — AB (ref 8–23)
CHLORIDE: 107 mmol/L (ref 98–111)
CO2: 22 mmol/L (ref 22–32)
Calcium: 9.4 mg/dL (ref 8.9–10.3)
Creatinine, Ser: 1.66 mg/dL — ABNORMAL HIGH (ref 0.61–1.24)
GFR calc Af Amer: 48 mL/min — ABNORMAL LOW (ref >60)
GFR calc non Af Amer: 41 mL/min — ABNORMAL LOW (ref >60)
Glucose, Bld: 91 mg/dL (ref 70–99)
POTASSIUM: 4.1 mmol/L (ref 3.5–5.1)
Sodium: 140 mmol/L (ref 135–145)
Total Protein: 8.3 g/dL — ABNORMAL HIGH (ref 6.5–8.1)

## 2018-01-18 MED ORDER — SODIUM CHLORIDE 0.9 % IV BOLUS
500.0000 mL | Freq: Once | INTRAVENOUS | Status: AC
  Administered 2018-01-18: 500 mL via INTRAVENOUS

## 2018-01-18 MED ORDER — PREDNISONE 10 MG (21) PO TBPK: ORAL_TABLET | ORAL | 0 refills | Status: DC

## 2018-01-18 MED ORDER — MORPHINE SULFATE (PF) 4 MG/ML IV SOLN
4.0000 mg | Freq: Once | INTRAVENOUS | Status: AC
  Administered 2018-01-18: 4 mg via INTRAVENOUS
  Filled 2018-01-18: qty 1

## 2018-01-18 MED ORDER — HYDROCODONE-ACETAMINOPHEN 5-325 MG PO TABS: 1.0000 | ORAL_TABLET | ORAL | 0 refills | Status: DC | PRN

## 2018-01-18 MED ORDER — ONDANSETRON HCL 4 MG/2ML IJ SOLN
4.0000 mg | Freq: Once | INTRAMUSCULAR | Status: AC
  Administered 2018-01-18: 4 mg via INTRAVENOUS
  Filled 2018-01-18: qty 2

## 2018-01-18 MED ORDER — DEXAMETHASONE SODIUM PHOSPHATE 10 MG/ML IJ SOLN
10.0000 mg | Freq: Once | INTRAMUSCULAR | Status: AC
  Administered 2018-01-18: 10 mg via INTRAVENOUS
  Filled 2018-01-18: qty 1

## 2018-01-18 NOTE — ED Triage Notes (Signed)
Patient of gout right wrist/hand x 1 week.  Patient c/o left lateral leg pain/knee pain since having a colonoscopy on 12/25/17.

## 2018-01-18 NOTE — ED Provider Notes (Signed)
[facility] DEPT Provider Note   CSN: [id number] Arrival date & time: 01/18/18  1024     History   Chief Complaint Chief Complaint  Patient presents with  . Gout  . Knee Pain    HPI Derrick Lawson is a 67 y.o. male.  Pt presents to the ED today with several problems.  Pt has gout in his right hand for the past week for which he's been taking allopurinol and colchicine.  He has left knee pain.  He has decreased sensation to the outside of left leg and posterior right leg.  He has back pain with movement.  No pain when he is sitting still.     Past Medical History:  Diagnosis Date  . Arthritis   . Gout   . Hypertension     Patient Active Problem List   Diagnosis Date Noted  . Post-operative state 08/02/2013  . Right inguinal hernia 06/14/2013    Past Surgical History:  Procedure Laterality Date  . HERNIA REPAIR    . INGUINAL HERNIA REPAIR Right 07/11/2013   Procedure: RIGHT HERNIA REPAIR INGUINAL ADULT;  Surgeon: Joyice Faster. Cornett, MD;  Location: [facility];  Service: General;  Laterality: Right;  . INSERTION OF MESH Right 07/11/2013   Procedure: INSERTION OF MESH;  Surgeon: Joyice Faster. Cornett, MD;  Location: [facility];  Service: General;  Laterality: Right;        Home Medications    Prior to Admission medications   Medication Sig Start Date End Date Taking? Authorizing Provider  allopurinol (ZYLOPRIM) 300 MG tablet Take 450 mg by mouth daily. 11/20/17  Yes [provider]  amLODipine (NORVASC) 10 MG tablet Take 10 mg by mouth daily. 10/27/17  Yes [provider]  atorvastatin (LIPITOR) 20 MG tablet Take 20 mg by mouth every evening.   Yes [provider]  colchicine 0.6 MG tablet Take 0.6 mg by mouth 2 (two) times daily. 11/20/17  Yes [provider]  HYDROcodone-acetaminophen (NORCO/VICODIN) 5-325 MG tablet Take 1 tablet by mouth every 4 (four) hours as needed.  01/18/18   Derrick Pence, MD  indomethacin (INDOCIN) 25 MG capsule Take 1 capsule (25 mg total) by mouth 3 (three) times daily as needed. Patient not taking: Reported on 01/18/2018 10/30/14   Derrick Docker, NP  naproxen sodium (ALEVE) 220 MG tablet Take 1 tablet (220 mg total) by mouth 2 (two) times daily with a meal. Patient not taking: Reported on 01/18/2018 06/07/14   Derrick Grit, MD  oxyCODONE (ROXICODONE) 5 MG immediate release tablet Take 1 tablet (5 mg total) by mouth every 6 (six) hours as needed for severe pain. Patient not taking: Reported on 01/18/2018 01/23/16   Derrick Dakin, MD  oxyCODONE-acetaminophen (PERCOCET) 5-325 MG tablet Take 2 tablets by mouth every 4 (four) hours as needed. Patient not taking: Reported on 01/18/2018 05/04/17   Derrick Ferguson, MD  predniSONE (STERAPRED UNI-PAK 21 TAB) 10 MG (21) TBPK tablet Take 6 tabs for 2 days, then 5 for 2 days, then 4 for 2 days, then 3 for 2 days, 2 for 2 days, then 1 for 2 days 01/18/18   Derrick Pence, MD    Family History History reviewed. No pertinent family history.  Social History Social History   Tobacco Use  . Smoking status: Never Smoker  . Smokeless tobacco: Never Used  Substance Use Topics  . Alcohol use: No  . Drug use: No     Allergies  Patient has no known allergies.   Review of Systems Review of Systems  Musculoskeletal:       Right hand, left knee, back pain  Neurological:       Decreased sensation left leg and right leg  All other systems reviewed and are negative.    Physical Exam Updated Vital Signs BP (!) 138/91 (BP Location: Left Arm)   Pulse 72   Temp 97.7 F (36.5 C) (Oral)   Resp 16   Ht 5\' 8"  (1.727 m)   Wt 77.1 kg   SpO2 100%   BMI 25.85 kg/m   Physical Exam  Constitutional: He is oriented to person, place, and time. He appears well-developed and well-nourished.  HENT:  Head: Normocephalic and atraumatic.  Right Ear: External ear normal.  Left Ear: External ear  normal.  Nose: Nose normal.  Mouth/Throat: Oropharynx is clear and moist.  Eyes: Pupils are equal, round, and reactive to light. Conjunctivae and EOM are normal.  Neck: Normal range of motion. Neck supple.  Cardiovascular: Normal rate, regular rhythm, normal heart sounds and intact distal pulses.  Pulmonary/Chest: Effort normal and breath sounds normal.  Abdominal: Soft. Bowel sounds are normal.  Musculoskeletal:       Right wrist: He exhibits tenderness.       Left knee: Tenderness found.       Lumbar back: He exhibits tenderness.  Right wrist with redness and swelling c/w gout.  Neurological: He is alert and oriented to person, place, and time.  Skin: Skin is warm. Capillary refill takes less than 2 seconds.  Psychiatric: He has a normal mood and affect.  Nursing note and vitals reviewed.    ED Treatments / Results  Labs (all labs ordered are listed, but only abnormal results are displayed) Labs Reviewed  COMPREHENSIVE METABOLIC PANEL - Abnormal; Notable for the following components:      Result Value   BUN 33 (*)    Creatinine, Ser 1.66 (*)    Total Protein 8.3 (*)    GFR calc non Af Amer 41 (*)    GFR calc Af Amer 48 (*)    All other components within normal limits  CBC WITH DIFFERENTIAL/PLATELET - Abnormal; Notable for the following components:   RBC 3.91 (*)    Hemoglobin 12.0 (*)    HCT 37.8 (*)    All other components within normal limits  URINALYSIS, ROUTINE W REFLEX MICROSCOPIC    EKG None  Radiology Dg Lumbar Spine Complete  Result Date: 01/18/2018 CLINICAL DATA:  Left lateral leg pain.  Knee pain. EXAM: LUMBAR SPINE - COMPLETE 4+ VIEW COMPARISON:  No prior. FINDINGS: Lumbar spine scoliosis concave left. No acute bony abnormality identified. Lumbar vertebrae numbered with the lowest definite ribbed vertebrae on lateral view as T12. L5-S1 fusion noted. Diffuse multilevel degenerative change. 8 mm anterolisthesis L4 on L5. Mild L3 compression, age undetermined.  Aortoiliac atherosclerotic vascular calcification. IMPRESSION: 1. Lumbar scoliosis concave left. Lumbar vertebrae numbered with the lowest definite ribbed vertebrae on lateral view as T12. L5-S1 fusion. Diffuse multilevel degenerative change with 8 mm anterolisthesis L4 on L5. 2. Mild L3 compression, age undetermined. 3. Aortoiliac atherosclerotic vascular calcification. Electronically Signed   By: Marcello Moores  Register   On: 01/18/2018 13:42   Dg Knee Complete 4 Views Left  Result Date: 01/18/2018 CLINICAL DATA:  Left knee pain for the past month.  No injury. EXAM: LEFT KNEE - COMPLETE 4+ VIEW COMPARISON:  Left knee x-rays dated January 23, 2016. FINDINGS: No acute  fracture or dislocation. Mild medial compartment joint space narrowing has progressed when compared to prior study. No joint effusion. Osteopenia. Vascular calcifications. Soft tissues are unremarkable. IMPRESSION: New mild medial compartment osteoarthritis. No acute osseous abnormality. Electronically Signed   By: Titus Dubin M.D.   On: 01/18/2018 13:37    Procedures Procedures (including critical care time)  Medications Ordered in ED Medications  sodium chloride 0.9 % bolus 500 mL (500 mLs Intravenous New Bag/Given 01/18/18 1347)  morphine 4 MG/ML injection 4 mg (4 mg Intravenous Given 01/18/18 1348)  ondansetron (ZOFRAN) injection 4 mg (4 mg Intravenous Given 01/18/18 1348)  dexamethasone (DECADRON) injection 10 mg (10 mg Intravenous Given 01/18/18 1348)     Initial Impression / Assessment and Plan / ED Course  I have reviewed the triage vital signs and the nursing notes.  Pertinent labs & imaging results that were available during my care of the patient were reviewed by me and considered in my medical decision making (see chart for details).   He is on allopurinol and colchicine for gout already.  He will be given a low purine diet.  Pt is feeling much better.  Sensation differences likely from a pinched nerve as he has  significant degenerative changes in his lumbar spine.  He will be d/c home on lortab and prednisone.  He is instructed to f/u with ortho.  Final Clinical Impressions(s) / ED Diagnoses   Final diagnoses:  Acute sciatica  Acute gout of right wrist, unspecified cause  Osteoarthritis of left knee, unspecified osteoarthritis type    ED Discharge Orders         Ordered    HYDROcodone-acetaminophen (NORCO/VICODIN) 5-325 MG tablet  Every 4 hours PRN     01/18/18 1449    predniSONE (STERAPRED UNI-PAK 21 TAB) 10 MG (21) TBPK tablet     01/18/18 1449           Derrick Pence, MD 01/18/18 1451

## 2018-01-22 DIAGNOSIS — M1712 Unilateral primary osteoarthritis, left knee: Secondary | ICD-10-CM | POA: Insufficient documentation

## 2018-02-07 DIAGNOSIS — M4316 Spondylolisthesis, lumbar region: Secondary | ICD-10-CM | POA: Insufficient documentation

## 2018-02-07 DIAGNOSIS — M5459 Other low back pain: Secondary | ICD-10-CM | POA: Insufficient documentation

## 2018-02-07 DIAGNOSIS — G894 Chronic pain syndrome: Secondary | ICD-10-CM | POA: Insufficient documentation

## 2018-02-07 DIAGNOSIS — M5136 Other intervertebral disc degeneration, lumbar region: Secondary | ICD-10-CM | POA: Insufficient documentation

## 2018-03-20 ENCOUNTER — Other Ambulatory Visit: Payer: Self-pay | Admitting: Nurse Practitioner

## 2018-03-21 ENCOUNTER — Other Ambulatory Visit: Payer: Self-pay | Admitting: Nurse Practitioner

## 2018-03-21 DIAGNOSIS — K7469 Other cirrhosis of liver: Secondary | ICD-10-CM

## 2018-04-02 ENCOUNTER — Ambulatory Visit
Admission: RE | Admit: 2018-04-02 | Discharge: 2018-04-02 | Disposition: A | Discharge status: Home / Self Care | Payer: 59 | Source: Ambulatory Visit | Attending: Nurse Practitioner | Admitting: Nurse Practitioner

## 2018-04-02 DIAGNOSIS — K7469 Other cirrhosis of liver: Secondary | ICD-10-CM

## 2018-10-20 ENCOUNTER — Other Ambulatory Visit: Payer: Self-pay

## 2018-10-20 ENCOUNTER — Ambulatory Visit (HOSPITAL_COMMUNITY)
Admission: EM | Admit: 2018-10-20 | Discharge: 2018-10-20 | Disposition: A | Discharge status: Home / Self Care | Payer: 59 | Attending: Internal Medicine | Admitting: Internal Medicine

## 2018-10-20 ENCOUNTER — Encounter (HOSPITAL_COMMUNITY): Payer: Self-pay

## 2018-10-20 DIAGNOSIS — M10062 Idiopathic gout, left knee: Secondary | ICD-10-CM

## 2018-10-20 MED ORDER — INDOMETHACIN 25 MG PO CAPS: 25.0000 mg | ORAL_CAPSULE | Freq: Three times a day (TID) | ORAL | 0 refills | Status: DC | PRN

## 2018-10-20 MED ORDER — PREDNISONE 20 MG PO TABS: 40.0000 mg | ORAL_TABLET | Freq: Every day | ORAL | 0 refills | Status: DC

## 2018-10-20 NOTE — Discharge Instructions (Signed)
Take medication as prescribed. If no improvement in 2 - 3 days, follow up with PCP for further evaluation and treatment. Keep knee elevated Ice knee 15 minutes 4 times per day.

## 2018-10-20 NOTE — ED Triage Notes (Signed)
Pt present left knee pain with swelling. Symptoms started Wednesday. Pt states that his knee is very painful to touch and to extend or bend.

## 2018-10-20 NOTE — ED Provider Notes (Signed)
[place]    CSN: [id number] Arrival date & time: 10/20/18  1024      History   Chief Complaint Chief Complaint  Patient presents with   Knee Pain    HPI Derrick Lawson is a 68 y.o. male.   Patient here concerned with L knee pain x 3 days.  Woke up in pain, no change in activity level, no known injury, fall, or trauma.  Pain is 8/10 on pain scale and does not radiate.  He admits swelling, tenderness lateral aspect of knee, mild warmth.  Denies ecchymosis, erythema, fever, chills, nausea, vomiting, diarrhea, myalgia.  He has tried OTC tylenol and ibuprofen without relief, none today.  He is not icing his knee.  Currently using solitary crutch to ambulate.  PMH Gout, last flare 1 - 2 years ago.     Past Medical History:  Diagnosis Date   Arthritis    Gout    Hypertension     Patient Active Problem List   Diagnosis Date Noted   Post-operative state 08/02/2013   Right inguinal hernia 06/14/2013    Past Surgical History:  Procedure Laterality Date   HERNIA REPAIR     INGUINAL HERNIA REPAIR Right 07/11/2013   Procedure: RIGHT HERNIA REPAIR INGUINAL ADULT;  Surgeon: Joyice Faster. Cornett, MD;  Location: [facility];  Service: General;  Laterality: Right;   INSERTION OF MESH Right 07/11/2013   Procedure: INSERTION OF MESH;  Surgeon: Joyice Faster. Cornett, MD;  Location: [facility];  Service: General;  Laterality: Right;       Home Medications    Prior to Admission medications   Medication Sig Start Date End Date Taking? Authorizing Provider  allopurinol (ZYLOPRIM) 300 MG tablet Take 450 mg by mouth daily. 11/20/17   [provider]  amLODipine (NORVASC) 10 MG tablet Take 10 mg by mouth daily. 10/27/17   [provider]  atorvastatin (LIPITOR) 20 MG tablet Take 20 mg by mouth every evening.    [provider]  colchicine 0.6 MG tablet Take 0.6 mg by mouth 2 (two) times daily. 11/20/17   [provider]  HYDROcodone-acetaminophen (NORCO/VICODIN) 5-325 MG tablet Take 1 tablet by mouth every 4 (four) hours as needed. 01/18/18   [name], MD  indomethacin (INDOCIN) 25 MG capsule Take 1 capsule (25 mg total) by mouth 3 (three) times daily as needed. 10/20/18   Peri Jefferson, PA-C  naproxen sodium (ALEVE) 220 MG tablet Take 1 tablet (220 mg total) by mouth 2 (two) times daily with a meal. Patient not taking: Reported on 01/18/2018 06/07/14   [name], MD  oxyCODONE (ROXICODONE) 5 MG immediate release tablet Take 1 tablet (5 mg total) by mouth every 6 (six) hours as needed for severe pain. Patient not taking: Reported on 01/18/2018 01/23/16   [name], MD  oxyCODONE-acetaminophen (PERCOCET) 5-325 MG tablet Take 2 tablets by mouth every 4 (four) hours as needed. Patient not taking: Reported on 01/18/2018 05/04/17   [name], MD  predniSONE (DELTASONE) 20 MG tablet Take 2 tablets (40 mg total) by mouth daily with breakfast. 10/20/18   Peri Jefferson, PA-C    Family History History reviewed. No pertinent family history.  Social History Social History   Tobacco Use   Smoking status: Never Smoker   Smokeless tobacco: Never Used  Substance Use Topics   Alcohol use: No   Drug use: No     Allergies   Patient has no known allergies.  Review of Systems Review of Systems  Constitutional: Negative for activity change, chills, fatigue and fever.  Respiratory: Negative for cough and shortness of breath.   Cardiovascular: Negative for chest pain, palpitations and leg swelling.  Gastrointestinal: Negative for abdominal pain, nausea and vomiting.  Genitourinary: Negative for difficulty urinating, dysuria, enuresis, flank pain and hematuria.  Musculoskeletal: Positive for gait problem and joint swelling. Negative for arthralgias, back pain and myalgias.  Skin: Negative for color change and rash.  Neurological: Positive for weakness. Negative for seizures and  syncope.  Hematological: Negative for adenopathy. Does not bruise/bleed easily.  Psychiatric/Behavioral: Positive for sleep disturbance. Negative for agitation and confusion.  All other systems reviewed and are negative.    Physical Exam Triage Vital Signs ED Triage Vitals  Enc Vitals Group     BP 10/20/18 1049 (!) 142/100     Pulse Rate 10/20/18 1049 80     Resp 10/20/18 1049 16     Temp 10/20/18 1049 97.7 F (36.5 C)     Temp Source 10/20/18 1049 Tympanic     SpO2 10/20/18 1049 99 %     Weight --      Height --      Head Circumference --      Peak Flow --      Pain Score 10/20/18 1055 8     Pain Loc --      Pain Edu? --      Excl. in [place]? --    No data found.  Updated Vital Signs BP (!) 142/100 (BP Location: Left Arm)    Pulse 80    Temp 97.7 F (36.5 C) (Tympanic)    Resp 16    SpO2 99%   Visual Acuity Right Eye Distance:   Left Eye Distance:   Bilateral Distance:    Right Eye Near:   Left Eye Near:    Bilateral Near:     Physical Exam Vitals signs and nursing note reviewed.  Constitutional:      Appearance: Normal appearance. He is well-developed.  HENT:     Head: Normocephalic and atraumatic.  Eyes:     General: No scleral icterus.    Extraocular Movements: Extraocular movements intact.     Conjunctiva/sclera: Conjunctivae normal.     Pupils: Pupils are equal, round, and reactive to light.  Neck:     Musculoskeletal: Normal range of motion and neck supple.  Cardiovascular:     Rate and Rhythm: Normal rate and regular rhythm.     Heart sounds: No murmur.  Pulmonary:     Effort: Pulmonary effort is normal. No respiratory distress.     Breath sounds: Normal breath sounds.  Musculoskeletal:     Left knee: He exhibits decreased range of motion and swelling. He exhibits no deformity, no erythema and no bony tenderness. Tenderness found. Medial joint line and lateral joint line tenderness noted. No MCL and no LCL tenderness noted.  Skin:    General: Skin  is warm and dry.     Capillary Refill: Capillary refill takes less than 2 seconds.     Coloration: Skin is not jaundiced.     Findings: No bruising or erythema.  Neurological:     General: No focal deficit present.     Mental Status: He is alert and oriented to person, place, and time.  Psychiatric:        Mood and Affect: Mood normal.        Behavior: Behavior normal.  UC Treatments / Results  Labs (all labs ordered are listed, but only abnormal results are displayed) Labs Reviewed - No data to display  EKG   Radiology No results found.  Procedures Procedures (including critical care time)  Medications Ordered in UC Medications - No data to display  Initial Impression / Assessment and Plan / UC Course  I have reviewed the triage vital signs and the nursing notes.  Pertinent labs & imaging results that were available during my care of the patient were reviewed by me and considered in my medical decision making (see chart for details).     Pain consistent with acute gouty attack.   Will start him on prednisone to reduce flare. Follow up with PCP Final Clinical Impressions(s) / UC Diagnoses   Final diagnoses:  Acute idiopathic gout of left knee     Discharge Instructions     Take medication as prescribed. If no improvement in 2 - 3 days, follow up with PCP for further evaluation and treatment. Keep knee elevated Ice knee 15 minutes 4 times per day.    ED Prescriptions    Medication Sig Dispense Auth. Provider   predniSONE (DELTASONE) 20 MG tablet Take 2 tablets (40 mg total) by mouth daily with breakfast. 6 tablet Peri Jefferson, PA-C   indomethacin (INDOCIN) 25 MG capsule Take 1 capsule (25 mg total) by mouth 3 (three) times daily as needed. 30 capsule Peri Jefferson, PA-C     Controlled Substance Prescriptions [place] Controlled Substance Registry consulted? Not Applicable   Peri Jefferson, Hershal Coria 10/20/18 1122

## 2019-01-02 ENCOUNTER — Other Ambulatory Visit: Payer: Self-pay | Admitting: Nephrology

## 2019-01-02 DIAGNOSIS — N1831 Chronic kidney disease, stage 3a: Secondary | ICD-10-CM

## 2019-01-11 ENCOUNTER — Ambulatory Visit
Admission: RE | Admit: 2019-01-11 | Discharge: 2019-01-11 | Disposition: A | Discharge status: Home / Self Care | Payer: 59 | Source: Ambulatory Visit | Attending: Nephrology | Admitting: Nephrology

## 2019-01-11 DIAGNOSIS — N1831 Chronic kidney disease, stage 3a: Secondary | ICD-10-CM

## 2019-01-29 ENCOUNTER — Other Ambulatory Visit: Payer: Self-pay

## 2019-01-29 DIAGNOSIS — Z20822 Contact with and (suspected) exposure to covid-19: Secondary | ICD-10-CM

## 2019-01-31 LAB — NOVEL CORONAVIRUS, NAA: SARS-CoV-2, NAA: NOT DETECTED

## 2019-02-05 ENCOUNTER — Other Ambulatory Visit: Payer: Self-pay

## 2019-02-05 DIAGNOSIS — Z20822 Contact with and (suspected) exposure to covid-19: Secondary | ICD-10-CM

## 2019-02-07 LAB — NOVEL CORONAVIRUS, NAA: SARS-CoV-2, NAA: NOT DETECTED

## 2019-06-23 ENCOUNTER — Encounter (HOSPITAL_COMMUNITY): Payer: Self-pay

## 2019-06-23 ENCOUNTER — Other Ambulatory Visit: Payer: Self-pay

## 2019-06-23 ENCOUNTER — Ambulatory Visit (HOSPITAL_COMMUNITY)
Admission: EM | Admit: 2019-06-23 | Discharge: 2019-06-23 | Disposition: A | Discharge status: Home / Self Care | Payer: 59 | Attending: Family Medicine | Admitting: Family Medicine

## 2019-06-23 DIAGNOSIS — M1A0620 Idiopathic chronic gout, left knee, without tophus (tophi): Secondary | ICD-10-CM | POA: Diagnosis not present

## 2019-06-23 DIAGNOSIS — M109 Gout, unspecified: Secondary | ICD-10-CM

## 2019-06-23 DIAGNOSIS — M1711 Unilateral primary osteoarthritis, right knee: Secondary | ICD-10-CM

## 2019-06-23 MED ORDER — PREDNISONE 50 MG PO TABS: ORAL_TABLET | ORAL | 1 refills | Status: DC

## 2019-06-23 NOTE — Discharge Instructions (Addendum)
You should see improvement in 24 - 48 hours.

## 2019-06-23 NOTE — ED Provider Notes (Addendum)
[place]    CSN: [id number] Arrival date & time: 06/23/19  1012      History   Chief Complaint Chief Complaint  Patient presents with  . knee and elbow pain    HPI Derrick Lawson is a 69 y.o. male.   Established [place] patient.  Pt c/o 5/10 dull pain in knees bilaterally for 1wk. Pt states it's painful to go up steps. Pt states the right knee gives more issues going up steps than the left knee. Pt c/o 7/10 stiff pain w/movement in right elbowx2 days. Pt has 2+ edema of right elbow. Pt walked to exam room.  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  Taking allopurinol as directd.     Past Medical History:  Diagnosis Date  . Arthritis   . Gout   . Hypertension     Patient Active Problem List   Diagnosis Date Noted  . Post-operative state 08/02/2013  . Right inguinal hernia 06/14/2013    Past Surgical History:  Procedure Laterality Date  . HERNIA REPAIR    . INGUINAL HERNIA REPAIR Right 07/11/2013   Procedure: RIGHT HERNIA REPAIR INGUINAL ADULT;  Surgeon: Joyice Faster. Cornett, MD;  Location: [facility];  Service: General;  Laterality: Right;  . INSERTION OF MESH Right 07/11/2013   Procedure: INSERTION OF MESH;  Surgeon: Joyice Faster. Cornett, MD;  Location: [facility];  Service: General;  Laterality: Right;       Home Medications    Prior to Admission medications   Medication Sig Start Date End Date Taking? Authorizing Provider  allopurinol (ZYLOPRIM) 300 MG tablet Take 450 mg by mouth daily. 11/20/17   [provider]  amLODipine (NORVASC) 10 MG tablet Take 10 mg by mouth daily. 10/27/17   [provider]  colchicine 0.6 MG tablet Take 0.6 mg by mouth 2 (two) times daily. 11/20/17   [provider]  predniSONE (DELTASONE) 50 MG tablet One daily 06/23/19   Robyn Haber, MD  atorvastatin (LIPITOR) 20 MG tablet Take 20 mg by mouth every evening.  06/23/19  [provider]     Family History History reviewed. No pertinent family history.  Social History Social History   Tobacco Use  . Smoking status: Never Smoker  . Smokeless tobacco: Never Used  Substance Use Topics  . Alcohol use: No  . Drug use: No     Allergies   Patient has no known allergies.   Review of Systems Review of Systems  Constitutional: Negative.   Musculoskeletal: Positive for gait problem and joint swelling.     Physical Exam Triage Vital Signs ED Triage Vitals  Enc Vitals Group     BP 06/23/19 1036 (!) 136/92     Pulse Rate 06/23/19 1036 87     Resp 06/23/19 1036 18     Temp 06/23/19 1036 97.8 F (36.6 C)     Temp Source 06/23/19 1036 Oral     SpO2 06/23/19 1036 98 %     Weight 06/23/19 1037 165 lb (74.8 kg)     Height 06/23/19 1037 5\' 8"  (1.727 m)     Head Circumference --      Peak Flow --      Pain Score 06/23/19 1036 5     Pain Loc --      Pain Edu? --      Excl. in [place]? --    No data found.  Updated Vital Signs BP (!) 136/92  Pulse 87   Temp 97.8 F (36.6 C) (Oral)   Resp 18   Ht 5\' 8"  (1.727 m)   Wt 74.8 kg   SpO2 98%   BMI 25.09 kg/m    Physical Exam Vitals and nursing note reviewed.  Constitutional:      Appearance: Normal appearance.  HENT:     Head: Normocephalic.  Eyes:     Conjunctiva/sclera: Conjunctivae normal.  Cardiovascular:     Rate and Rhythm: Normal rate.  Pulmonary:     Effort: Pulmonary effort is normal.  Musculoskeletal:        General: Swelling and tenderness present. No deformity or signs of injury. Normal range of motion.     Cervical back: Normal range of motion and neck supple.     Comments: Mildly swollen olecranon bursa on right Mild knee effusions, worse on left Mildly tender knees bilaterally  Skin:    General: Skin is warm and dry.  Neurological:     General: No focal deficit present.     Mental Status: He is alert.  Psychiatric:        Mood and Affect: Mood normal.        Behavior: Behavior  normal.        Thought Content: Thought content normal.        Judgment: Judgment normal.      UC Treatments / Results  Labs (all labs ordered are listed, but only abnormal results are displayed) Labs Reviewed - No data to display  EKG   Radiology No results found.  Procedures Procedures (including critical care time)  Medications Ordered in UC Medications - No data to display  Initial Impression / Assessment and Plan / UC Course  I have reviewed the triage vital signs and the nursing notes.  Pertinent labs & imaging results that were available during my care of the patient were reviewed by me and considered in my medical decision making (see chart for details).    Final Clinical Impressions(s) / UC Diagnoses   Final diagnoses:  Chronic gout of left knee, unspecified cause  Acute gout of right elbow, unspecified cause  Primary osteoarthritis of right knee     Discharge Instructions     You should see improvement in 24 - 48 hours.    ED Prescriptions    Medication Sig Dispense Auth. Provider   predniSONE (DELTASONE) 50 MG tablet One daily 5 tablet Robyn Haber, MD     I have reviewed the PDMP during this encounter.   Robyn Haber, MD 06/23/19 1102    Robyn Haber, MD 06/23/19 1102

## 2019-06-23 NOTE — ED Triage Notes (Signed)
Pt c/o 5/10 dull pain in knees bilatx1wk. Pt states it's painful to go up steps. Pt states the right knee gives more issues going up steps than the left knee. Pt c/o 7/10 stiff pain w/movement in right elbowx2 days. Pt has 2+ edema of right elbow. Pt walked to exam room.

## 2019-08-28 DIAGNOSIS — M1A09X1 Idiopathic chronic gout, multiple sites, with tophus (tophi): Secondary | ICD-10-CM | POA: Insufficient documentation

## 2019-10-25 ENCOUNTER — Other Ambulatory Visit: Payer: Self-pay | Admitting: Urology

## 2019-10-25 ENCOUNTER — Other Ambulatory Visit (HOSPITAL_COMMUNITY): Payer: Self-pay | Admitting: Urology

## 2019-10-25 DIAGNOSIS — C61 Malignant neoplasm of prostate: Secondary | ICD-10-CM

## 2019-11-01 ENCOUNTER — Other Ambulatory Visit: Payer: Self-pay

## 2019-11-01 ENCOUNTER — Encounter (HOSPITAL_COMMUNITY)
Admission: RE | Admit: 2019-11-01 | Discharge: 2019-11-01 | Disposition: A | Discharge status: Home / Self Care | Payer: 59 | Source: Ambulatory Visit | Attending: Urology | Admitting: Urology

## 2019-11-01 DIAGNOSIS — C61 Malignant neoplasm of prostate: Secondary | ICD-10-CM | POA: Insufficient documentation

## 2019-11-01 MED ORDER — TECHNETIUM TC 99M MEDRONATE IV KIT
20.0000 | PACK | Freq: Once | INTRAVENOUS | Status: AC | PRN
  Administered 2019-11-01: 21.9 via INTRAVENOUS

## 2019-11-06 DIAGNOSIS — Z8616 Personal history of COVID-19: Secondary | ICD-10-CM

## 2019-11-06 HISTORY — DX: Personal history of COVID-19: Z86.16

## 2019-11-14 DIAGNOSIS — C61 Malignant neoplasm of prostate: Secondary | ICD-10-CM | POA: Insufficient documentation

## 2019-11-16 ENCOUNTER — Telehealth: Payer: Self-pay | Admitting: Family

## 2019-11-16 NOTE — Telephone Encounter (Signed)
Called to Discuss with patient about Covid symptoms and the use of the monoclonal antibody infusion for those with mild to moderate Covid symptoms and at a high risk of hospitalization.     Pt appears to qualify for this infusion due to co-morbid conditions and/or a member of an at-risk group in accordance with the FDA Emergency Use Authorization.   Derrick Lawson was seen in his primary care office through telemedicine on 11/14/2019 with nasal congestion, cough, fatigue, and body aches.  Symptom onset was 11/07/2019.  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  I was unable to get in touch with Mr. Perrell telephone and left a generic voicemail.  My chart message was also sent. He would need to be infused no later than 9/19 given symptom onset date.   Terri Piedra, NP 11/16/2019 8:57 AM

## 2019-11-17 ENCOUNTER — Other Ambulatory Visit: Payer: Self-pay | Admitting: Physician Assistant

## 2019-11-17 ENCOUNTER — Telehealth: Payer: Self-pay | Admitting: Physician Assistant

## 2019-11-17 ENCOUNTER — Ambulatory Visit (HOSPITAL_COMMUNITY)
Admission: RE | Admit: 2019-11-17 | Discharge: 2019-11-17 | Disposition: A | Discharge status: Home / Self Care | Payer: 59 | Source: Ambulatory Visit | Attending: Pulmonary Disease | Admitting: Pulmonary Disease

## 2019-11-17 DIAGNOSIS — U071 COVID-19: Secondary | ICD-10-CM

## 2019-11-17 DIAGNOSIS — I10 Essential (primary) hypertension: Secondary | ICD-10-CM | POA: Diagnosis not present

## 2019-11-17 DIAGNOSIS — C61 Malignant neoplasm of prostate: Secondary | ICD-10-CM | POA: Insufficient documentation

## 2019-11-17 MED ORDER — ALBUTEROL SULFATE HFA 108 (90 BASE) MCG/ACT IN AERS: 2.0000 | INHALATION_SPRAY | Freq: Once | RESPIRATORY_TRACT | Status: DC | PRN

## 2019-11-17 MED ORDER — METHYLPREDNISOLONE SODIUM SUCC 125 MG IJ SOLR: 125.0000 mg | Freq: Once | INTRAMUSCULAR | Status: DC | PRN

## 2019-11-17 MED ORDER — FAMOTIDINE IN NACL 20-0.9 MG/50ML-% IV SOLN: 20.0000 mg | Freq: Once | INTRAVENOUS | Status: DC | PRN

## 2019-11-17 MED ORDER — EPINEPHRINE 0.3 MG/0.3ML IJ SOAJ: 0.3000 mg | Freq: Once | INTRAMUSCULAR | Status: DC | PRN

## 2019-11-17 MED ORDER — DIPHENHYDRAMINE HCL 50 MG/ML IJ SOLN: 50.0000 mg | Freq: Once | INTRAMUSCULAR | Status: DC | PRN

## 2019-11-17 MED ORDER — SODIUM CHLORIDE 0.9 % IV SOLN
1200.0000 mg | Freq: Once | INTRAVENOUS | Status: AC
  Administered 2019-11-17: 1200 mg via INTRAVENOUS

## 2019-11-17 MED ORDER — SODIUM CHLORIDE 0.9 % IV SOLN: INTRAVENOUS | Status: DC | PRN

## 2019-11-17 NOTE — Telephone Encounter (Signed)
Called to discuss with patient about Covid symptoms and the use of casirivimab/imdevimab, a monoclonal antibody infusion for those with mild to moderate Covid symptoms and at a high risk of hospitalization.  Pt is qualified for this infusion at the [facility] due to; Specific high risk criteria : Older age (>/= 68 yo), Chronic Kidney Disease (CKD) and Immunosuppressive Disease or Treatment   Message left to call back our hotline [phone]. Today is his last day to be infused (9/19)  Angelena Form PA-C  [facility]

## 2019-11-17 NOTE — Progress Notes (Addendum)
  Diagnosis: COVID-19  Physician: Asencion Noble, MD  Procedure: Covid Infusion Clinic Med: casirivimab\imdevimab infusion - Provided patient with casirivimab\imdevimab fact sheet for patients, parents and caregivers prior to infusion.  Complications: No immediate complications noted.  Discharge: Discharged home   Derrick Lawson 11/17/2019

## 2019-11-17 NOTE — Progress Notes (Signed)
I connected by phone with Derrick Lawson on 11/17/2019 at 2:10 PM to discuss the potential use of a new treatment for mild to moderate COVID-19 viral infection in non-hospitalized patients.  This patient is a 69 y.o. male that meets the FDA criteria for Emergency Use Authorization of COVID monoclonal antibody casirivimab/imdevimab.  Has a (+) direct SARS-CoV-2 viral test result  Has mild or moderate COVID-19   Is NOT hospitalized due to COVID-19  Is within 10 days of symptom onset  Has at least one of the high risk factor(s) for progression to severe COVID-19 and/or hospitalization as defined in EUA.  Specific high risk criteria : Older age (>/= 68 yo), Immunosuppressive Disease or Treatment and Cardiovascular disease or hypertension   I have spoken and communicated the following to the patient or parent/caregiver regarding COVID monoclonal antibody treatment:  1. FDA has authorized the emergency use for the treatment of mild to moderate COVID-19 in adults and pediatric patients with positive results of direct SARS-CoV-2 viral testing who are 10 years of age and older weighing at least 40 kg, and who are at high risk for progressing to severe COVID-19 and/or hospitalization.  2. The significant known and potential risks and benefits of COVID monoclonal antibody, and the extent to which such potential risks and benefits are unknown.  3. Information on available alternative treatments and the risks and benefits of those alternatives, including clinical trials.  4. Patients treated with COVID monoclonal antibody should continue to self-isolate and use infection control measures (e.g., wear mask, isolate, social distance, avoid sharing personal items, clean and disinfect "high touch" surfaces, and frequent handwashing) according to CDC guidelines.   5. The patient or parent/caregiver has the option to accept or refuse COVID monoclonal antibody treatment.  After reviewing this information  with the patient, The patient agreed to proceed with receiving casirivimab\imdevimab infusion and will be provided a copy of the Fact sheet prior to receiving the infusion.  Sx onset 9/9. Set up for infusion on 9/19 @ 2:30pm. Directions given to [facility]. Pt is aware that insurance will be charged an infusion fee.   Angelena Form 11/17/2019 2:10 PM

## 2019-11-17 NOTE — Discharge Instructions (Signed)

## 2019-11-19 ENCOUNTER — Ambulatory Visit
Admission: RE | Admit: 2019-11-19 | Discharge: 2019-11-19 | Disposition: A | Discharge status: Home / Self Care | Payer: 59 | Source: Ambulatory Visit | Attending: Radiation Oncology | Admitting: Radiation Oncology

## 2019-11-19 ENCOUNTER — Other Ambulatory Visit: Payer: Self-pay

## 2019-11-19 ENCOUNTER — Encounter: Payer: Self-pay | Admitting: Radiation Oncology

## 2019-11-19 VITALS — Ht 68.0 in | Wt 170.0 lb

## 2019-11-19 DIAGNOSIS — C61 Malignant neoplasm of prostate: Secondary | ICD-10-CM

## 2019-11-19 HISTORY — DX: Malignant neoplasm of prostate: C61

## 2019-11-19 NOTE — Progress Notes (Signed)
GU Location of Tumor / Histology: prostatic adenocarcinoma  If Prostate Cancer, Gleason Score is (4 + 4) and PSA is (38.20). Prostate volume: 28 g  Loni Muse Dumm saw Dr. Janice Norrie in 2014 and his PSA was 4.7-7.26. He declined prostate biopsy and MRI was equivocal 10/2012. He did not return for follow up. His 08/2019 psa was 38.20.  Biopsies of prostate (if applicable) revealed:   Past/Anticipated interventions by urology, if any: prostate biopsy, CT abd/pelvis (negative), bone scan (negative), referral to Dr. Tammi Klippel to discuss radiotherapeutic options  Past/Anticipated interventions by medical oncology, if any: no  Weight changes, if any: denies  Bowel/Bladder complaints, if any: IPSS 1 with nocturia. SHIM 23. Denies dysuria, hematuria, urinary leakage or incontinence. Denies diarrhea.    Nausea/Vomiting, if any: no  Pain issues, if any:  Reports intermittent pain in knee related to arthritis.  SAFETY ISSUES:  Prior radiation? no  Pacemaker/ICD? no  Possible current pregnancy? no, male patient  Is the patient on methotrexate? no  Current Complaints / other details:  69 year old male. Married with one son and three daughters. Resides in [place]. Hx of hernia repairs. Patient tx for COVID. Presently, patient's wife is in ICU with COVID. Patient's sister with hx of prostate ca.

## 2019-11-19 NOTE — Progress Notes (Signed)
Radiation Oncology         [phone] ________________________________  Initial Outpatient Consultation - Conducted via Telephone due to current COVID-19 concerns for limiting patient exposure  Name: Derrick Lawson MRN: [id number]  Date: 11/19/2019  DOB: 1950-10-05  DP:[id number], Cherylann Ratel, PA-C  Festus Aloe, MD   REFERRING PHYSICIAN: Festus Aloe, MD  DIAGNOSIS: 69 y.o. gentleman with Stage T1c adenocarcinoma of the prostate with Gleason score of 4+4, and PSA of 38.2.    ICD-10-CM   1. Prostate cancer [facility])  [facility] referral to Social Work    HISTORY OF PRESENT ILLNESS: Derrick Lawson is a 69 y.o. male with a diagnosis of prostate cancer. He has a longstanding history of elevated PSA and initially saw Dr. Janice Norrie in 2014 with a PSA of 5.02. A repeat PSA in 10/2012 was 7.26. This prompted a prostate MRI on 11/22/2012 showing focal T2 hyperintensity at the right prostatic base but no evidence of locally advanced disease. Prostate biopsy was recommended at that but he declined and was subsequently lost to follow up.  More recently, he was referred back to [facility] with Dr. Junious Silk on 09/18/2019 for a PSA of 61 on labs with his PCP in 06/2019. Digital rectal examination was performed at that time revealing no nodules. A repeat PSA that day had decreased but remained significantly elevated at 38.2. Therefore, the patient proceeded to transrectal ultrasound with 12 biopsies of the prostate on 10/14/2019.  The prostate volume measured 27.45 cc.  Out of 12 core biopsies, 10 were positive.  The maximum Gleason score was 4+4, seen in the left mid lateral, and Gleason 3+5, seen in the right mid lateral. Additionally, Gleason 4+3 was seen in the right apex lateral, right apex, left apex, left apex lateral, and left base lateral, Gleason 3+4 in the right mid, and small foci of Gleason 3+3 in the right base lateral and left mid.  He underwent staging scans on 11/01/2019. CT pelvis showed  no adenopathy or acute bony process. Bone scan was also negative for osseous metastatic disease.  The patient reviewed the biopsy results with his urologist and he has kindly been referred today for discussion of potential radiation treatment options.  Of note, his wife is currently in the ICU on a ventilator at [facility] with COVID-19.  He was recently given the monoclonal antibody infusion prophylactically given his high risk exposure and is awaiting his COVID-19 test results at present.  PREVIOUS RADIATION THERAPY: No  PAST MEDICAL HISTORY:  Past Medical History:  Diagnosis Date  . Arthritis   . Gout   . Hypertension   . Prostate cancer ([place])       PAST SURGICAL HISTORY: Past Surgical History:  Procedure Laterality Date  . HERNIA REPAIR    . INGUINAL HERNIA REPAIR Right 07/11/2013   Procedure: RIGHT HERNIA REPAIR INGUINAL ADULT;  Surgeon: Joyice Faster. Cornett, MD;  Location: [place];  Service: General;  Laterality: Right;  . INSERTION OF MESH Right 07/11/2013   Procedure: INSERTION OF MESH;  Surgeon: Joyice Faster. Cornett, MD;  Location: [place];  Service: General;  Laterality: Right;  . PROSTATE BIOPSY      FAMILY HISTORY:  Family History  Problem Relation Age of Onset  . Breast cancer Sister   . Prostate cancer Neg Hx   . Colon cancer Neg Hx   . Pancreatic cancer Neg Hx     SOCIAL HISTORY:  Social History   Socioeconomic History  .  Marital status: Married    Spouse name: Not on file  . Number of children: Not on file  . Years of education: Not on file  . Highest education level: Not on file  Occupational History  . Not on file  Tobacco Use  . Smoking status: Never Smoker  . Smokeless tobacco: Never Used  Vaping Use  . Vaping Use: Never used  Substance and Sexual Activity  . Alcohol use: No  . Drug use: No  . Sexual activity: Not Currently  Other Topics Concern  . Not on file  Social History Narrative  . Not on file    Social Determinants of Health   Financial Resource Strain:   . Difficulty of Paying Living Expenses: Not on file  Food Insecurity:   . Worried About Charity fundraiser in the Last Year: Not on file  . Ran Out of Food in the Last Year: Not on file  Transportation Needs:   . Lack of Transportation (Medical): Not on file  . Lack of Transportation (Non-Medical): Not on file  Physical Activity:   . Days of Exercise per Week: Not on file  . Minutes of Exercise per Session: Not on file  Stress:   . Feeling of Stress : Not on file  Social Connections:   . Frequency of Communication with Friends and Family: Not on file  . Frequency of Social Gatherings with Friends and Family: Not on file  . Attends Religious Services: Not on file  . Active Member of Clubs or Organizations: Not on file  . Attends Archivist Meetings: Not on file  . Marital Status: Not on file  Intimate Partner Violence:   . Fear of Current or Ex-Partner: Not on file  . Emotionally Abused: Not on file  . Physically Abused: Not on file  . Sexually Abused: Not on file    ALLERGIES: Patient has no known allergies.  MEDICATIONS:  Current Outpatient Medications  Medication Sig Dispense Refill  . amLODipine (NORVASC) 10 MG tablet Take 10 mg by mouth daily.    [name] atorvastatin (LIPITOR) 40 MG tablet Take 40 mg by mouth daily.    . febuxostat (ULORIC) 40 MG tablet Take 40 mg by mouth daily.     No current facility-administered medications for this encounter.    REVIEW OF SYSTEMS:  On review of systems, the patient reports that he is doing well overall. He denies any chest pain, shortness of breath, cough, fevers, chills, night sweats, unintended weight changes. He denies any bowel disturbances, and denies abdominal pain, nausea or vomiting. He denies any new musculoskeletal or joint aches or pains. His IPSS was 1, indicating minimal urinary symptoms. He reports nocturia x1. His SHIM was 23, indicating he does not  have erectile dysfunction. A complete review of systems is obtained and is otherwise negative.    PHYSICAL EXAM:  Wt Readings from Last 3 Encounters:  11/19/19 170 lb (77.1 kg)  06/23/19 165 lb (74.8 kg)  01/18/18 170 lb (77.1 kg)   Temp Readings from Last 3 Encounters:  11/17/19 98 F (36.7 C) (Oral)  06/23/19 97.8 F (36.6 C) (Oral)  10/20/18 97.7 F (36.5 C) (Tympanic)   BP Readings from Last 3 Encounters:  11/17/19 (!) 137/104  06/23/19 (!) 136/92  10/20/18 (!) 142/100   Pulse Readings from Last 3 Encounters:  11/17/19 96  06/23/19 87  10/20/18 80   Pain Assessment Pain Score: 0-No pain Pain Frequency: Intermittent Pain Loc: Knee (left knee osteoarthritis)/10  Physical exam not performed in light of telephone consult visit format.   KPS = 90  100 - Normal; no complaints; no evidence of disease. 90   - Able to carry on normal activity; minor signs or symptoms of disease. 80   - Normal activity with effort; some signs or symptoms of disease. 60   - Cares for self; unable to carry on normal activity or to do active work. 60   - Requires occasional assistance, but is able to care for most of his personal needs. 50   - Requires considerable assistance and frequent medical care. 40   - Disabled; requires special care and assistance. 90   - Severely disabled; hospital admission is indicated although death not imminent. 67   - Very sick; hospital admission necessary; active supportive treatment necessary. 10   - Moribund; fatal processes progressing rapidly. 0     - Dead  Karnofsky DA, Abelmann [place], Craver LS and Burchenal JH [phone]) The use of the nitrogen mustards in the palliative treatment of carcinoma: with particular reference to bronchogenic carcinoma Cancer 1 [phone]  LABORATORY DATA:  Lab Results  Component Value Date   WBC 7.6 01/18/2018   HGB 12.0 (L) 01/18/2018   HCT 37.8 (L) 01/18/2018   MCV 96.7 01/18/2018   PLT 185 01/18/2018   Lab Results   Component Value Date   NA 140 01/18/2018   K 4.1 01/18/2018   CL 107 01/18/2018   CO2 22 01/18/2018   Lab Results  Component Value Date   ALT 18 01/18/2018   AST 22 01/18/2018   ALKPHOS 72 01/18/2018   BILITOT 0.7 01/18/2018     RADIOGRAPHY: NM Bone Scan Whole Body  Result Date: 11/02/2019 CLINICAL DATA:  Prostate cancer with elevated serum PSA level. No recent trauma. Left knee pain. EXAM: NUCLEAR MEDICINE WHOLE BODY BONE SCAN TECHNIQUE: Whole body anterior and posterior images were obtained approximately 3 hours after intravenous injection of radiopharmaceutical. RADIOPHARMACEUTICALS:  21.9 mCi Technetium-51m MDP IV COMPARISON:  Limited correlation made with lumbar spine radiographs 01/18/2018, chest radiographs 07/19/2006 and pelvic CT 11/01/2019. FINDINGS: There is no osseous uptake suspicious for metastatic disease. Mild activity in the thoracolumbar spine appears degenerative. There is multifocal arthropathic activity within the shoulders, elbows, wrists, knees and feet. The soft tissue activity appears normal. IMPRESSION: 1. No osseous uptake favoring metastatic disease. 2. Multifocal degenerative activity as described. Electronically Signed   By: Richardean Sale M.D.   On: 11/02/2019 13:44      IMPRESSION/PLAN: This visit was conducted via Telephone to spare the patient unnecessary potential exposure in the healthcare setting during the current COVID-19 pandemic. 1. 69 y.o. gentleman with Stage T1c adenocarcinoma of the prostate with Gleason Score of 4+4, and PSA of 38.2. We discussed the patient's workup and outlined the nature of prostate cancer in this setting. The patient's T stage, Gleason's score, and PSA put him into the high risk group. Accordingly, he is eligible for a variety of potential treatment options including prostatectomy or LT-ADT in combination with either 8 weeks of external radiation or 5 weeks of external radiation preceded by a brachytherapy boost. We discussed  the available radiation techniques, and focused on the details and logistics of delivery. We discussed and outlined the risks, benefits, short and long-term effects associated with radiotherapy and compared and contrasted these with prostatectomy. We discussed the role of SpaceOAR in reducing the rectal toxicity associated with radiotherapy. We also detailed the role of ADT in the treatment of  high risk prostate cancer and outlined the associated side effects that could be expected with this therapy. He was encouraged to ask questions that were answered to his stated satisfaction.  At the end of the conversation, the patient is interested in moving forward with LT-ADT concurrent with a brachytherapy boost and use of SpaceOAR gel followed by a 5 week course of daily radiotherapy. He has not received his first Lupron injection. We will share our discussion with Dr. Junious Silk.  He is 8 days out from his monoclonal antibody infusion, so we will await his COVID-19 test results before getting him back in with Dr. Junious Silk.  We discussed the rationale behind the intentional 2 month delay in starting radiation following initiation of ADT to allow for the radiosensitizing effects of this therapy to take effect. The patient will be contacted by Romie Jumper in our office who will be working closely with him to coordinate OR scheduling and pre and post procedure appointments, in anticipation of proceeding with the brachytherapy boost procedure in mid-late November, approximately 2 months from the start of ADT. We will contact the pharmaceutical rep to ensure that [employer] is available at the time of procedure.  We we will plan to see him back in the clinic approximately 3 weeks after his seed boost procedure, for CT simulation/treatment planning in anticipation of beginning a 5-week course of daily radiotherapy shortly thereafter to complete his treatment.  He appears to have a good understanding of his disease and our  treatment recommendations which are of curative intent and he is in agreement with the stated plan.  Given current concerns for patient exposure during the COVID-19 pandemic, this encounter was conducted via telephone. The patient was notified in advance and was offered a MyChart meeting to allow for face to face communication but unfortunately reported that he did not have the appropriate resources/technology to support such a visit and instead preferred to proceed with telephone consult. The patient has given verbal consent for this type of encounter. The time spent during this encounter was 30 minutes. The attendants for this meeting include Tyler Pita MD, Ashlyn Bruning PA-C, [place], and patient Derrick Lawson. During the encounter, Tyler Pita MD, Ashlyn Bruning PA-C, and scribe, Wilburn Mylar were located at [place].  Patient Derrick Lawson was located at home.    Nicholos Johns, PA-C    Tyler Pita, MD  [facility] Direct Dial: [phone]  Fax: [phone] [email or web address]  Skype  [email or web address]  This document serves as a record of services personally performed by Tyler Pita, MD and Freeman Caldron, PA-C. It was created on their behalf by Wilburn Mylar, a trained medical scribe. The creation of this record is based on the scribe's personal observations and the provider's statements to them. This document has been checked and approved by the attending provider.

## 2019-11-20 ENCOUNTER — Encounter: Payer: Self-pay | Admitting: Licensed Clinical Social Worker

## 2019-11-20 NOTE — Progress Notes (Signed)
[facility] Psychosocial Distress Screening Clinical Social Work  Clinical Social Work was referred by distress screening protocol.  The patient scored a 5 on the Psychosocial Distress Thermometer which indicates moderate distress. Clinical Social Worker contacted patient by phone to assess for distress and other psychosocial needs.   Patient has a good understanding of his plan as discussed yesterday with rad onc team. He has other major stressors right now with spouse hospitalized with Covid and is trying to cope with that concern.   CSW informed patient of the support team and support services at [facility].  CSW provided contact information and encouraged patient to call with any questions or concerns.   ONCBCN DISTRESS SCREENING 11/19/2019  Screening Type Initial Screening  Distress experienced in past week (1-10) 5  Family Problem type Partner  Emotional problem type Nervousness/Anxiety  Physician notified of physical symptoms Yes  Referral to clinical psychology No  Referral to clinical social work Yes  Referral to dietition No  Referral to financial advocate No  Referral to support programs Yes  Referral to palliative care No    Clinical Social Worker follow up needed: No.  If yes, follow up plan:  Julya Alioto, [place], LCSW

## 2019-11-21 ENCOUNTER — Telehealth: Payer: Self-pay | Admitting: *Deleted

## 2019-11-21 NOTE — Telephone Encounter (Signed)
CALLED PATIENT TO ASK QUESTIONS, LVM FOR A RETURN CALL 

## 2019-11-26 ENCOUNTER — Telehealth: Payer: Self-pay | Admitting: *Deleted

## 2019-11-26 NOTE — Telephone Encounter (Signed)
CALLED PATIENT TO INFORM THAT [facility] IS WAITING FOR INSURANCE APPROVAL FOR ADT, SPOKE WITH PATIENT AND HE IS AWARE OF THIS

## 2019-12-09 ENCOUNTER — Encounter: Payer: Self-pay | Admitting: Medical Oncology

## 2019-12-09 NOTE — Progress Notes (Signed)
Lauren from [facility] called stating patient scheduled for ADT 10/12.

## 2019-12-09 NOTE — Progress Notes (Signed)
Spoke with Derrick Lawson as follow up to radiation consult 11/19/19. He has not heard from [facility] regarding hormone injection. The last he heard was 9/28, that [facility] was waiting for insurance approval. I will follow up and call him back. He voiced understanding. He did share he lost his wife 2 weeks ago to [place]. I offered my condolences.

## 2019-12-09 NOTE — Progress Notes (Signed)
Called patient to inform him I called [facility] to follow up on insurance approval of ADT. Per Sharee Pimple it has been approved but not scheduled. I left a message with Dr. Lyndal Rainbow nurse and asked her to please schedule. I asked him to call me if he does not hear back from [facility] with an appointment by late tomorrow. He was very appreciative and voiced understanding.

## 2019-12-10 ENCOUNTER — Encounter: Payer: Self-pay | Admitting: Medical Oncology

## 2019-12-10 NOTE — Progress Notes (Signed)
Patient called to inform me he received his hormone injection this morning. We discussed the next steps of treatment and he is aware Enid Derry will be calling him with appointments. I asked him to call me with questions or concerns. He voiced understanding. Ashlyn,PA and [place] notified of the above.

## 2019-12-18 ENCOUNTER — Other Ambulatory Visit: Payer: Self-pay | Admitting: Urology

## 2019-12-19 ENCOUNTER — Ambulatory Visit (HOSPITAL_COMMUNITY)
Admission: EM | Admit: 2019-12-19 | Discharge: 2019-12-19 | Disposition: A | Discharge status: Home / Self Care | Payer: 59

## 2019-12-19 ENCOUNTER — Encounter (HOSPITAL_COMMUNITY): Payer: Self-pay | Admitting: *Deleted

## 2019-12-19 ENCOUNTER — Other Ambulatory Visit: Payer: Self-pay

## 2019-12-19 DIAGNOSIS — M25511 Pain in right shoulder: Secondary | ICD-10-CM | POA: Diagnosis not present

## 2019-12-19 DIAGNOSIS — M79642 Pain in left hand: Secondary | ICD-10-CM

## 2019-12-19 DIAGNOSIS — M25512 Pain in left shoulder: Secondary | ICD-10-CM

## 2019-12-19 MED ORDER — PREDNISONE 10 MG (21) PO TBPK: ORAL_TABLET | ORAL | 0 refills | Status: DC

## 2019-12-19 NOTE — ED Triage Notes (Signed)
PT reports his wife died recently and he is under a lot of stress.

## 2019-12-19 NOTE — ED Provider Notes (Signed)
[facility]    CSN: [id number] Arrival date & time: 12/19/19  1019      History   Chief Complaint Chief Complaint  Patient presents with  . Hand Pain    LT  . Knee Pain    LT   . Shoulder Pain    BIL    HPI Derrick Lawson is a 69 y.o. male.   Patient is a 69 year old male who presents today for left hand pain, redness and swelling.  History of gout.  Feels like this is a gout flareup.  Has been taking colchicine without much relief.  He is also had bilateral shoulder pain and does have a history of arthritis.  Has been under a lot of stress recently and not sure about his diet.  No fevers, chills, injuries.     Past Medical History:  Diagnosis Date  . Arthritis   . Gout   . Hypertension   . Prostate cancer [facility])     Patient Active Problem List   Diagnosis Date Noted  . Malignant neoplasm of prostate ([place]) 11/14/2019  . 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 Chronic pain disorder 02/07/2018  . Lumbar degenerative disc disease 02/07/2018  . Lumbar facet joint pain 02/07/2018  . Spondylolisthesis of lumbar region 02/07/2018  . Primary osteoarthritis of left knee 01/22/2018  . Elevated PSA 11/02/2017  . Mixed hyperlipidemia 10/27/2017  . Portal hypertension ([place]) 10/24/2017  . CKD (chronic kidney disease) stage 3, GFR 30-59 ml/min ([facility]) 09/19/2017  . Essential hypertension 08/21/2017  . Hepatitis C virus infection cured after antiviral drug therapy 08/21/2017  . Post-operative state 08/02/2013  . Right inguinal hernia 06/14/2013    Past Surgical History:  Procedure Laterality Date  . HERNIA REPAIR    . INGUINAL HERNIA REPAIR Right 07/11/2013   Procedure: RIGHT HERNIA REPAIR INGUINAL ADULT;  Surgeon: Joyice Faster. Cornett, MD;  Location: [place];  Service: General;  Laterality: Right;  . INSERTION OF MESH Right 07/11/2013   Procedure: INSERTION OF MESH;  Surgeon: Joyice Faster. Cornett, MD;  Location: [place];  Service: General;  Laterality: Right;  . PROSTATE BIOPSY         Home Medications    Prior to Admission medications   Medication Sig Start Date End Date Taking? Authorizing Provider  amLODipine (NORVASC) 10 MG tablet Take 10 mg by mouth daily. 10/27/17  Yes [provider]  atorvastatin (LIPITOR) 40 MG tablet Take 40 mg by mouth daily. 10/13/19  Yes [provider]  colchicine 0.6 MG tablet Take 0.6 mg by mouth daily.   Yes [provider]  febuxostat (ULORIC) 40 MG tablet Take 40 mg by mouth daily. 08/26/19  Yes [provider]  predniSONE (STERAPRED UNI-PAK 21 TAB) 10 MG (21) TBPK tablet 6 tabs for 1 day, then 5 tabs for 1 das, then 4 tabs for 1 day, then 3 tabs for 1 day, 2 tabs for 1 day, then 1 tab for 1 day 12/19/19   Orvan July, NP    Family History Family History  Problem Relation Age of Onset  . Breast cancer Sister   . Prostate cancer Neg Hx   . Colon cancer Neg Hx   . Pancreatic cancer Neg Hx     Social History Social History   Tobacco Use  . Smoking status: Never Smoker  . Smokeless tobacco: Never Used  Vaping Use  . Vaping Use: Never used  Substance Use Topics  . Alcohol use: No  . Drug use: No     Allergies   Patient has no known allergies.   Review of Systems Review of Systems   Physical Exam Triage Vital Signs ED Triage Vitals  Enc Vitals Group     BP 12/19/19 1140 138/82     Pulse Rate 12/19/19 1140 89     Resp 12/19/19 1140 18     Temp 12/19/19 1140 98.2 F (36.8 C)     Temp Source 12/19/19 1140 Oral     SpO2 12/19/19 1140 97 %     Weight 12/19/19 1144 168 lb (76.2 kg)     Height 12/19/19 1144 5\' 8"  (1.727 m)     Head Circumference --      Peak Flow --      Pain Score 12/19/19 1143 7     Pain Loc --      Pain Edu? --      Excl. in [place]? --    No data found.  Updated Vital Signs BP 138/82 (BP Location: Right Arm)   Pulse 89   Temp 98.2 F (36.8 C) (Oral)   Resp 18   Ht  5\' 8"  (1.727 m)   Wt 168 lb (76.2 kg)   SpO2 97%   BMI 25.54 kg/m   Visual Acuity Right Eye Distance:   Left Eye Distance:   Bilateral Distance:    Right Eye Near:   Left Eye Near:    Bilateral Near:     Physical Exam Vitals and nursing note reviewed.  Constitutional:      Appearance: Normal appearance.  HENT:     Head: Normocephalic and atraumatic.     Nose: Nose normal.  Eyes:     Conjunctiva/sclera: Conjunctivae normal.  Pulmonary:     Effort: Pulmonary effort is normal.  Musculoskeletal:        General: Normal range of motion.       Hands:     Cervical back: Normal range of motion.     Comments: Redness, swelling, TTP Normal ROM  Skin:    General: Skin is warm and dry.  Neurological:     Mental Status: He is alert.  Psychiatric:        Mood and Affect: Mood normal.      UC Treatments / Results  Labs (all labs ordered are listed, but only abnormal results are displayed) Labs Reviewed - No data to display  EKG   Radiology No results found.  Procedures Procedures (including critical care time)  Medications Ordered in UC Medications - No data to display  Initial Impression / Assessment and Plan / UC Course  I have reviewed the triage vital signs and the nursing notes.  Pertinent labs & imaging results that were available during my care of the patient were reviewed by me and considered in my medical decision making (see chart for details).     Hand pain Bilateral shoulder pain Most likely arthritis related versus gout. Treating with prednisone taper over the next 6 days. Recommended follow-up with his doctor next Wednesday as planned  Final Clinical Impressions(s) / UC Diagnoses   Final diagnoses:  Pain of both shoulder joints  Hand pain, left     Discharge Instructions     Take the medicine as prescribed with food   ED Prescriptions    Medication Sig Dispense Auth. Provider   predniSONE (STERAPRED UNI-PAK 21 TAB) 10 MG (21) TBPK  tablet 6 tabs  for 1 day, then 5 tabs for 1 das, then 4 tabs for 1 day, then 3 tabs for 1 day, 2 tabs for 1 day, then 1 tab for 1 day 21 tablet Jerry Haugen A, NP     PDMP not reviewed this encounter.   Orvan July, NP 12/19/19 1244

## 2019-12-19 NOTE — ED Triage Notes (Signed)
Pt has swelling and redness to Lt hand Pt reports he has a gout flare up. He also has Lt knee pain and bilateral shoulder pain . Pt feels pain is due to gout.

## 2019-12-19 NOTE — Discharge Instructions (Signed)
Take the medicine as prescribed with food

## 2019-12-28 ENCOUNTER — Encounter (HOSPITAL_COMMUNITY): Payer: Self-pay

## 2019-12-28 ENCOUNTER — Emergency Department (HOSPITAL_COMMUNITY): Payer: 59

## 2019-12-28 ENCOUNTER — Emergency Department (HOSPITAL_COMMUNITY)
Admission: EM | Admit: 2019-12-28 | Discharge: 2019-12-28 | Disposition: A | Discharge status: Home / Self Care | Payer: 59 | Attending: Emergency Medicine | Admitting: Emergency Medicine

## 2019-12-28 ENCOUNTER — Other Ambulatory Visit: Payer: Self-pay

## 2019-12-28 DIAGNOSIS — Z8546 Personal history of malignant neoplasm of prostate: Secondary | ICD-10-CM | POA: Diagnosis not present

## 2019-12-28 DIAGNOSIS — I129 Hypertensive chronic kidney disease with stage 1 through stage 4 chronic kidney disease, or unspecified chronic kidney disease: Secondary | ICD-10-CM | POA: Diagnosis not present

## 2019-12-28 DIAGNOSIS — M25511 Pain in right shoulder: Secondary | ICD-10-CM | POA: Diagnosis not present

## 2019-12-28 DIAGNOSIS — N183 Chronic kidney disease, stage 3 unspecified: Secondary | ICD-10-CM | POA: Insufficient documentation

## 2019-12-28 DIAGNOSIS — Z79899 Other long term (current) drug therapy: Secondary | ICD-10-CM | POA: Diagnosis not present

## 2019-12-28 MED ORDER — DICLOFENAC SODIUM 1 % EX GEL: 2.0000 g | Freq: Four times a day (QID) | CUTANEOUS | 0 refills | Status: AC

## 2019-12-28 MED ORDER — KETOROLAC TROMETHAMINE 30 MG/ML IJ SOLN
15.0000 mg | Freq: Once | INTRAMUSCULAR | Status: AC
  Administered 2019-12-28: 15 mg via INTRAMUSCULAR
  Filled 2019-12-28: qty 1

## 2019-12-28 NOTE — Discharge Instructions (Signed)
Use the voltaren gel as directed.   You were given information to follow-up with an orthopedic doctor in regards to your shoulder pain.  Please call the office on Monday to schedule an appointment for follow-up within the next 5 to 7 days.  If you have any new or worsening symptoms in the meantime please return the emergency department immediately.

## 2019-12-28 NOTE — ED Triage Notes (Signed)
Pt reports right shoulder pain on going since September when he fell after finding out bad news.

## 2019-12-28 NOTE — ED Provider Notes (Signed)
[facility] DEPT Provider Note   CSN: [id number] Arrival date & time: 12/28/19  0601     History Chief Complaint  Patient presents with   Shoulder Pain    Derrick Lawson is a 69 y.o. male.  HPI   69 year old male with a history of arthritis, gout, hypertension, prostate cancer, presents emergency department today for evaluation of right shoulder pain.  States pain has been present for the last several days but last night the pain seemed worse and caused him to not be able to sleep.  He states he does not have any pain at rest but pain is exacerbated by movement.  He tried taking some hydroxyzine to help him sleep but states it did not work.  He has not tried any other interventions for his symptoms.  He states he fell a month ago after finding out that his wife passed away however he did not initially have any shoulder pain after this fall.  He did have some bilateral shoulder pain about a week ago and was seen in urgent care.  He was given a course of prednisone which he states improved his symptoms temporarily.  Denies any fevers, chest pain, shortness of breath or other systemic symptoms.  Past Medical History:  Diagnosis Date   Arthritis    Gout    Hypertension    Prostate cancer [facility])     Patient Active Problem List   Diagnosis Date Noted   Malignant neoplasm of prostate ([place]) 11/14/2019   Idiopathic chronic gout of multiple sites with tophus 08/28/2019   Chronic pain disorder 02/07/2018   Lumbar degenerative disc disease 02/07/2018   Lumbar facet joint pain 02/07/2018   Spondylolisthesis of lumbar region 02/07/2018   Primary osteoarthritis of left knee 01/22/2018   Elevated PSA 11/02/2017   Mixed hyperlipidemia 10/27/2017   Portal hypertension ([place]) 10/24/2017   CKD (chronic kidney disease) stage 3, GFR 30-59 ml/min ([place]) 09/19/2017   Essential hypertension 08/21/2017   Hepatitis C virus infection 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 Cornett, MD;  Location: [facility];  Service: General;  Laterality: Right;   INSERTION OF MESH Right 07/11/2013   Procedure: INSERTION OF MESH;  Surgeon: Joyice Faster. Cornett, MD;  Location: [facility];  Service: General;  Laterality: Right;   PROSTATE BIOPSY         Family History  Problem Relation Age of Onset   Breast cancer Sister    Prostate cancer Neg Hx    Colon cancer Neg Hx    Pancreatic cancer Neg Hx     Social History   Tobacco Use   Smoking status: Never Smoker   Smokeless tobacco: Never Used  Vaping Use   Vaping Use: Never used  Substance Use Topics   Alcohol use: No   Drug use: No    Home Medications Prior to Admission medications   Medication Sig Start Date End Date Taking? Authorizing Provider  amLODipine (NORVASC) 10 MG tablet Take 10 mg by mouth daily. 10/27/17   [provider]  atorvastatin (LIPITOR) 40 MG tablet Take 40 mg by mouth daily. 10/13/19   [provider]  colchicine 0.6 MG tablet Take 0.6 mg by mouth  daily.    [provider]  diclofenac Sodium (VOLTAREN) 1 % GEL Apply 2 g topically 4 (four) times daily. 12/28/19   Tedi Hughson S, PA-C  febuxostat (ULORIC) 40 MG tablet Take 40 mg by mouth daily. 08/26/19   [provider]  predniSONE (STERAPRED UNI-PAK 21 TAB) 10 MG (21) TBPK tablet 6 tabs for 1 day, then 5 tabs for 1 das, then 4 tabs for 1 day, then 3 tabs for 1 day, 2 tabs for 1 day, then 1 tab for 1 day 12/19/19   Orvan July, NP    Allergies    Patient has no known allergies.  Review of Systems   Review of Systems  Constitutional: Negative for fever.  Respiratory: Negative for  shortness of breath.   Cardiovascular: Negative for chest pain.  Gastrointestinal: Negative for abdominal pain, diarrhea and vomiting.  Musculoskeletal: Negative for back pain and neck pain.       Right shoulder pain  Neurological: Negative for weakness and numbness.    Physical Exam Updated Vital Signs BP (!) 147/100 (BP Location: Left Arm)    Pulse (!) 101    Temp 99 F (37.2 C) (Oral)    Resp 16    Ht 5\' 8"  (1.727 m)    Wt 77.1 kg    SpO2 99%    BMI 25.85 kg/m   Physical Exam Constitutional:      General: He is not in acute distress.    Appearance: He is well-developed.  Eyes:     Conjunctiva/sclera: Conjunctivae normal.  Cardiovascular:     Rate and Rhythm: Normal rate and regular rhythm.  Pulmonary:     Effort: Pulmonary effort is normal.     Breath sounds: Normal breath sounds.  Musculoskeletal:     Comments: Diffuse TTP throughout the right shoulder joint. There is some mild warmth but no noted erythema or swelling. Decreased ROM of the right shoulder 2/2 pain. There is no significant discomfort with passive ROM. NVI distally.   Skin:    General: Skin is warm and dry.  Neurological:     Mental Status: He is alert and oriented to person, place, and time.     ED Results / Procedures / Treatments   Labs (all labs ordered are listed, but only abnormal results are displayed) Labs Reviewed - No data to display  EKG None  Radiology DG Shoulder Right  Result Date: 12/28/2019 CLINICAL DATA:  Right shoulder pain.  Patient fell in September. EXAM: RIGHT SHOULDER - 2+ VIEW COMPARISON:  07/19/2006. FINDINGS: No fracture.  No bone lesion. AC joint narrowing and marginal spurring consistent mild to moderate osteoarthritis. There is narrowing of the glenohumeral joint inferiorly. Soft tissues are unremarkable. IMPRESSION: 1. No fracture or dislocation. 2. Mild to moderate AC joint osteoarthritis. Mild glenohumeral joint arthropathic changes reflected by joint space narrowing.  Electronically Signed   By: Lajean Manes M.D.   On: 12/28/2019 06:33    Procedures Procedures (including critical care time)  Medications Ordered in ED Medications  ketorolac (TORADOL) 30 MG/ML injection 15 mg (has no administration in time range)    ED Course  I have reviewed the triage vital signs and the nursing notes.  Pertinent labs & imaging results that were available during my care of the patient were reviewed by me and considered in my medical decision making (see chart for details).    MDM Rules/Calculators/A&P  Pt present with right shoulder pain ongoing for several days. On exam joints were tender with limited ROM, but no current medical concern for septic arthritis or joint as pt is afebrile & without erythema/swelling of the affected area.  Patient X-Ray reviewed/interpreted and negative for obvious fracture or dislocation. Xray consistent with osteoarthritis. Presentation c/w dx of OA. Pain managed in ED. Pt advised to follow up with orthopedics if symptoms persist for further evaluation if conservative therapies do not work. Recommended PT, exercise, and tylenol. Shot of low dose toradol given and rx for voltaren gel given. Sling given for comfort. Return precaurtions discussed. Patient will be dc home & is agreeable with above plan.   Final Clinical Impression(s) / ED Diagnoses Final diagnoses:  Acute pain of right shoulder    Rx / DC Orders ED Discharge Orders         Ordered    diclofenac Sodium (VOLTAREN) 1 % GEL  4 times daily        12/28/19 0732           Rodney Booze, PA-C 12/28/19 0737    Daleen Bo, MD 12/28/19 1702

## 2019-12-28 NOTE — ED Provider Notes (Signed)
  Face-to-face evaluation   History: He presents for evaluation of ongoing right shoulder pain following a fall.  He denies neck pain, paresthesia or weakness.  Pain is primarily with movement.  Physical exam: Right shoulder, diffusely tender without swelling.  He guards against movement.  He has decreased active motion secondary to pain.  Medical screening examination/treatment/procedure(s) were conducted as a shared visit with non-physician practitioner(s) and myself.  I personally evaluated the patient during the encounter    Daleen Bo, MD 12/28/19 1701

## 2019-12-28 NOTE — ED Notes (Signed)
Patient transported to X-ray 

## 2019-12-28 NOTE — ED Notes (Signed)
Right shoulder immobilizer/sling applied.

## 2020-01-09 ENCOUNTER — Ambulatory Visit: Payer: Self-pay | Admitting: Urology

## 2020-01-09 ENCOUNTER — Ambulatory Visit: Payer: 59 | Admitting: Radiation Oncology

## 2020-01-15 ENCOUNTER — Telehealth: Payer: Self-pay | Admitting: *Deleted

## 2020-01-15 NOTE — Telephone Encounter (Signed)
CALLED PATIENT TO REMIND OF PRE-SEED APPTS. FOR 01-16-20, LVM FOR A RETURN CALL

## 2020-01-16 ENCOUNTER — Other Ambulatory Visit (HOSPITAL_COMMUNITY): Payer: 59

## 2020-01-16 ENCOUNTER — Other Ambulatory Visit: Payer: Self-pay

## 2020-01-16 ENCOUNTER — Ambulatory Visit
Admission: RE | Admit: 2020-01-16 | Discharge: 2020-01-16 | Disposition: A | Discharge status: Home / Self Care | Payer: 59 | Source: Ambulatory Visit | Attending: Urology | Admitting: Urology

## 2020-01-16 ENCOUNTER — Ambulatory Visit (HOSPITAL_COMMUNITY)
Admission: RE | Admit: 2020-01-16 | Discharge: 2020-01-16 | Disposition: A | Discharge status: Home / Self Care | Payer: 59 | Source: Ambulatory Visit | Attending: Urology | Admitting: Urology

## 2020-01-16 ENCOUNTER — Encounter (HOSPITAL_COMMUNITY)
Admission: RE | Admit: 2020-01-16 | Discharge: 2020-01-16 | Disposition: A | Discharge status: Home / Self Care | Payer: 59 | Source: Ambulatory Visit | Attending: Urology | Admitting: Urology

## 2020-01-16 ENCOUNTER — Ambulatory Visit
Admission: RE | Admit: 2020-01-16 | Discharge: 2020-01-16 | Disposition: A | Discharge status: Home / Self Care | Payer: 59 | Source: Ambulatory Visit | Attending: Radiation Oncology | Admitting: Radiation Oncology

## 2020-01-16 DIAGNOSIS — C61 Malignant neoplasm of prostate: Secondary | ICD-10-CM | POA: Insufficient documentation

## 2020-01-18 NOTE — Progress Notes (Signed)
  Radiation Oncology         [phone] ________________________________  Name: Derrick Lawson MRN: [id number]  Date: 01/16/2020  DOB: [birth year]  SIMULATION AND TREATMENT PLANNING NOTE PUBIC ARCH STUDY  Derrick Lawson, Marina Gravel, MD  DIAGNOSIS:  69 y.o. gentleman with Stage T1c adenocarcinoma of the prostate with Gleason score of 4+4, and PSA of 38.2.  Oncology History   No history exists.      ICD-10-CM   1. Malignant neoplasm of prostate ([place])  C61     COMPLEX SIMULATION:  The patient presented today for evaluation for possible prostate seed implant. He was brought to the radiation planning suite and placed supine on the CT couch. A 3-dimensional image study set was obtained in upload to the planning computer. There, on each axial slice, I contoured the prostate gland. Then, using three-dimensional radiation planning tools I reconstructed the prostate in view of the structures from the transperineal needle pathway to assess for possible pubic arch interference. In doing so, I did not appreciate any pubic arch interference. Also, the patient's prostate volume was estimated based on the drawn structure. The volume was 27 cc.  Given the pubic arch appearance and prostate volume, patient remains a good candidate to proceed with prostate seed implant. Today, he freely provided informed written consent to proceed.    PLAN: The patient will undergo prostate seed implant.   ________________________________  Sheral Apley. Tammi Klippel, M.D.

## 2020-01-29 DIAGNOSIS — M7501 Adhesive capsulitis of right shoulder: Secondary | ICD-10-CM

## 2020-01-29 HISTORY — DX: Adhesive capsulitis of right shoulder: M75.01

## 2020-02-11 ENCOUNTER — Encounter (HOSPITAL_COMMUNITY)
Admission: RE | Admit: 2020-02-11 | Discharge: 2020-02-11 | Disposition: A | Discharge status: Home / Self Care | Payer: 59 | Source: Ambulatory Visit | Attending: Urology | Admitting: Urology

## 2020-02-11 ENCOUNTER — Other Ambulatory Visit: Payer: Self-pay

## 2020-02-11 ENCOUNTER — Other Ambulatory Visit (HOSPITAL_COMMUNITY)
Admission: RE | Admit: 2020-02-11 | Discharge: 2020-02-11 | Disposition: A | Discharge status: Home / Self Care | Payer: 59 | Source: Ambulatory Visit | Attending: Urology | Admitting: Urology

## 2020-02-11 DIAGNOSIS — Z20822 Contact with and (suspected) exposure to covid-19: Secondary | ICD-10-CM | POA: Insufficient documentation

## 2020-02-11 DIAGNOSIS — Z01812 Encounter for preprocedural laboratory examination: Secondary | ICD-10-CM | POA: Insufficient documentation

## 2020-02-11 LAB — COMPREHENSIVE METABOLIC PANEL
ALT: 29 U/L (ref 0–44)
AST: 32 U/L (ref 15–41)
Albumin: 4.1 g/dL (ref 3.5–5.0)
Alkaline Phosphatase: 81 U/L (ref 38–126)
Anion gap: 9 (ref 5–15)
BUN: 31 mg/dL — ABNORMAL HIGH (ref 8–23)
CO2: 26 mmol/L (ref 22–32)
Calcium: 9.6 mg/dL (ref 8.9–10.3)
Chloride: 106 mmol/L (ref 98–111)
Creatinine, Ser: 1.56 mg/dL — ABNORMAL HIGH (ref 0.61–1.24)
GFR, Estimated: 48 mL/min — ABNORMAL LOW (ref >60)
Glucose, Bld: 125 mg/dL — ABNORMAL HIGH (ref 70–99)
Potassium: 4.6 mmol/L (ref 3.5–5.1)
Sodium: 141 mmol/L (ref 135–145)
Total Bilirubin: 0.7 mg/dL (ref 0.3–1.2)
Total Protein: 7.9 g/dL (ref 6.5–8.1)

## 2020-02-11 LAB — CBC
HCT: 36.5 % — ABNORMAL LOW (ref 39.0–52.0)
Hemoglobin: 11.7 g/dL — ABNORMAL LOW (ref 13.0–17.0)
MCH: 30.2 pg (ref 26.0–34.0)
MCHC: 32.1 g/dL (ref 30.0–36.0)
MCV: 94.3 fL (ref 80.0–100.0)
Platelets: 179 10*3/uL (ref 150–400)
RBC: 3.87 MIL/uL — ABNORMAL LOW (ref 4.22–5.81)
RDW: 13.7 % (ref 11.5–15.5)
WBC: 6.9 10*3/uL (ref 4.0–10.5)
nRBC: 0 % (ref 0.0–0.2)

## 2020-02-11 LAB — SARS CORONAVIRUS 2 (TAT 6-24 HRS): SARS Coronavirus 2: NEGATIVE

## 2020-02-11 LAB — APTT: aPTT: 35 seconds (ref 24–36)

## 2020-02-11 LAB — PROTIME-INR
INR: 1 (ref 0.8–1.2)
Prothrombin Time: 13.2 seconds (ref 11.4–15.2)

## 2020-02-13 ENCOUNTER — Other Ambulatory Visit: Payer: Self-pay

## 2020-02-13 ENCOUNTER — Telehealth: Payer: Self-pay | Admitting: *Deleted

## 2020-02-13 ENCOUNTER — Encounter (HOSPITAL_BASED_OUTPATIENT_CLINIC_OR_DEPARTMENT_OTHER): Payer: Self-pay | Admitting: Urology

## 2020-02-13 NOTE — Progress Notes (Signed)
Spoke w/ via phone for pre-op interview--- PT Lab needs dos----  no              Lab results------ pt had CBC, CMP, PT/ PTT done 02-11-2020 results in epic;  Current ekg/ cxr in epic/ chart COVID test ------ done 02-11-2020 negative results in epic Arrive at ------- 1015 NPO after MN NO Solid Food.  Clear liquids from MN until--- 0915 Medications to take morning of surgery ----- Norvasc Diabetic medication ----- n/a Patient Special Instructions ----- to do one fleet enema morning of surgery Pre-Op special Istructions ----- n/a Patient verbalized understanding of instructions that were given at this phone interview. Patient denies shortness of breath, chest pain, fever, cough at this phone interview.   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  PCP:  Trenton Founds PA (lov 01-15-2020 epic) Cardiologist :  No Rheumatology:  Dr Jerilynn Mages. [facility] 08-26-2019 care everywhere) GI:  Dr Cindee Salt  (lov 10-24-2017 epic) [facility] [place] office) :  lov 03-19-2018 care everywere Chest x-ray :  01-16-2020 epic EKG :  01-16-2020 epic Echo :  no Stress test:  no Cardiac Cath :  no Activity level:  Denies sob w/ any activity Sleep Study/ CPAP :  NO Fasting Blood Sugar :      / Checks Blood Sugar -- times a day:   N/A Blood Thinner/ Instructions Maryjane Hurter Dose:  NO ASA / Instructions/ Last Dose :  NO

## 2020-02-13 NOTE — Telephone Encounter (Signed)
CALLED PATIENT TO REMIND OF PROCEDURE FOR 02-14-20, SPOKE WITH PATIENT AND HE IS AWARE OF THIS PROCEDURE

## 2020-02-14 ENCOUNTER — Other Ambulatory Visit: Payer: Self-pay

## 2020-02-14 ENCOUNTER — Ambulatory Visit (HOSPITAL_BASED_OUTPATIENT_CLINIC_OR_DEPARTMENT_OTHER): Payer: 59 | Admitting: Certified Registered"

## 2020-02-14 ENCOUNTER — Encounter (HOSPITAL_BASED_OUTPATIENT_CLINIC_OR_DEPARTMENT_OTHER): Payer: Self-pay | Admitting: Urology

## 2020-02-14 ENCOUNTER — Encounter (HOSPITAL_BASED_OUTPATIENT_CLINIC_OR_DEPARTMENT_OTHER)
Admission: RE | Disposition: A | Discharge status: Home / Self Care | Payer: Self-pay | Source: Home / Self Care | Attending: Urology

## 2020-02-14 ENCOUNTER — Ambulatory Visit (HOSPITAL_COMMUNITY): Payer: 59

## 2020-02-14 ENCOUNTER — Ambulatory Visit (HOSPITAL_BASED_OUTPATIENT_CLINIC_OR_DEPARTMENT_OTHER)
Admission: RE | Admit: 2020-02-14 | Discharge: 2020-02-14 | Disposition: A | Discharge status: Home / Self Care | Payer: 59 | Attending: Urology | Admitting: Urology

## 2020-02-14 DIAGNOSIS — C61 Malignant neoplasm of prostate: Secondary | ICD-10-CM | POA: Diagnosis not present

## 2020-02-14 DIAGNOSIS — Z79899 Other long term (current) drug therapy: Secondary | ICD-10-CM | POA: Insufficient documentation

## 2020-02-14 HISTORY — DX: Other intervertebral disc degeneration, lumbosacral region without mention of lumbar back pain or lower extremity pain: M51.379

## 2020-02-14 HISTORY — DX: Personal history of other diseases of the digestive system: Z87.19

## 2020-02-14 HISTORY — DX: Nocturia: R35.1

## 2020-02-14 HISTORY — DX: Chronic kidney disease, stage 3 unspecified: N18.30

## 2020-02-14 HISTORY — DX: Unspecified viral hepatitis C without hepatic coma: B19.20

## 2020-02-14 HISTORY — DX: Spinal stenosis, lumbar region without neurogenic claudication: M48.061

## 2020-02-14 HISTORY — DX: Unspecified symptoms and signs involving the genitourinary system: R39.9

## 2020-02-14 HISTORY — PX: SPACE OAR INSTILLATION: SHX6769

## 2020-02-14 HISTORY — DX: Other intervertebral disc degeneration, lumbosacral region: M51.37

## 2020-02-14 HISTORY — PX: RADIOACTIVE SEED IMPLANT: SHX5150

## 2020-02-14 HISTORY — DX: Idiopathic chronic gout, multiple sites, with tophus (tophi): M1A.09X1

## 2020-02-14 HISTORY — DX: Unspecified osteoarthritis, unspecified site: M19.90

## 2020-02-14 HISTORY — DX: Cyst of kidney, acquired: N28.1

## 2020-02-14 HISTORY — PX: CYSTOSCOPY: SHX5120

## 2020-02-14 HISTORY — DX: Hyperlipidemia, unspecified: E78.5

## 2020-02-14 SURGERY — INSERTION, RADIATION SOURCE, PROSTATE
Anesthesia: General | Site: Rectum

## 2020-02-14 MED ORDER — MIDAZOLAM HCL 2 MG/2ML IJ SOLN
INTRAMUSCULAR | Status: AC
  Filled 2020-02-14: qty 2

## 2020-02-14 MED ORDER — SODIUM CHLORIDE 0.9 % IR SOLN
Status: DC | PRN
  Administered 2020-02-14: 1000 mL via INTRAVESICAL

## 2020-02-14 MED ORDER — SODIUM CHLORIDE (PF) 0.9 % IJ SOLN
INTRAMUSCULAR | Status: DC | PRN
  Administered 2020-02-14: 10 mL

## 2020-02-14 MED ORDER — FLEET ENEMA 7-19 GM/118ML RE ENEM: 1.0000 | ENEMA | Freq: Once | RECTAL | Status: DC

## 2020-02-14 MED ORDER — CIPROFLOXACIN IN D5W 400 MG/200ML IV SOLN
400.0000 mg | INTRAVENOUS | Status: AC
  Administered 2020-02-14: 12:00:00 400 mg via INTRAVENOUS

## 2020-02-14 MED ORDER — EPHEDRINE SULFATE-NACL 50-0.9 MG/10ML-% IV SOSY
PREFILLED_SYRINGE | INTRAVENOUS | Status: DC | PRN
  Administered 2020-02-14: 15 mg via INTRAVENOUS
  Administered 2020-02-14 (×3): 5 mg via INTRAVENOUS

## 2020-02-14 MED ORDER — PHENYLEPHRINE 40 MCG/ML (10ML) SYRINGE FOR IV PUSH (FOR BLOOD PRESSURE SUPPORT)
PREFILLED_SYRINGE | INTRAVENOUS | Status: DC | PRN
  Administered 2020-02-14 (×2): 40 ug via INTRAVENOUS
  Administered 2020-02-14 (×2): 80 ug via INTRAVENOUS
  Administered 2020-02-14 (×2): 40 ug via INTRAVENOUS

## 2020-02-14 MED ORDER — LIDOCAINE 2% (20 MG/ML) 5 ML SYRINGE
INTRAMUSCULAR | Status: DC | PRN
  Administered 2020-02-14: 100 mg via INTRAVENOUS

## 2020-02-14 MED ORDER — LACTATED RINGERS IV SOLN: INTRAVENOUS | Status: DC | PRN

## 2020-02-14 MED ORDER — CIPROFLOXACIN IN D5W 400 MG/200ML IV SOLN
INTRAVENOUS | Status: AC
  Filled 2020-02-14: qty 200

## 2020-02-14 MED ORDER — FENTANYL CITRATE (PF) 100 MCG/2ML IJ SOLN
INTRAMUSCULAR | Status: DC | PRN
  Administered 2020-02-14 (×2): 25 ug via INTRAVENOUS
  Administered 2020-02-14: 50 ug via INTRAVENOUS
  Administered 2020-02-14: 25 ug via INTRAVENOUS

## 2020-02-14 MED ORDER — OXYCODONE-ACETAMINOPHEN 7.5-325 MG PO TABS: 1.0000 | ORAL_TABLET | Freq: Four times a day (QID) | ORAL | 0 refills | Status: AC | PRN

## 2020-02-14 MED ORDER — PROPOFOL 10 MG/ML IV BOLUS
INTRAVENOUS | Status: AC
  Filled 2020-02-14: qty 20

## 2020-02-14 MED ORDER — PROPOFOL 10 MG/ML IV BOLUS
INTRAVENOUS | Status: DC | PRN
  Administered 2020-02-14: 170 mg via INTRAVENOUS
  Administered 2020-02-14: 30 mg via INTRAVENOUS

## 2020-02-14 MED ORDER — SODIUM CHLORIDE 0.9 % IV SOLN: INTRAVENOUS | Status: DC

## 2020-02-14 MED ORDER — MIDAZOLAM HCL 5 MG/5ML IJ SOLN
INTRAMUSCULAR | Status: DC | PRN
  Administered 2020-02-14: 2 mg via INTRAVENOUS

## 2020-02-14 MED ORDER — FENTANYL CITRATE (PF) 100 MCG/2ML IJ SOLN
INTRAMUSCULAR | Status: AC
  Filled 2020-02-14: qty 2

## 2020-02-14 MED ORDER — DEXAMETHASONE SODIUM PHOSPHATE 10 MG/ML IJ SOLN
INTRAMUSCULAR | Status: AC
  Filled 2020-02-14: qty 1

## 2020-02-14 MED ORDER — CEPHALEXIN 500 MG PO CAPS: 500.0000 mg | ORAL_CAPSULE | Freq: Three times a day (TID) | ORAL | 0 refills | Status: DC

## 2020-02-14 MED ORDER — FENTANYL CITRATE (PF) 100 MCG/2ML IJ SOLN
25.0000 ug | INTRAMUSCULAR | Status: DC | PRN
Start: 2020-02-14 — End: 2020-02-14

## 2020-02-14 MED ORDER — IOHEXOL 300 MG/ML  SOLN
INTRAMUSCULAR | Status: DC | PRN
  Administered 2020-02-14: 12:00:00 7 mL

## 2020-02-14 MED ORDER — ONDANSETRON HCL 4 MG/2ML IJ SOLN
INTRAMUSCULAR | Status: AC
  Filled 2020-02-14: qty 2

## 2020-02-14 MED ORDER — DEXMEDETOMIDINE (PRECEDEX) IN NS 20 MCG/5ML (4 MCG/ML) IV SYRINGE
PREFILLED_SYRINGE | INTRAVENOUS | Status: DC | PRN
  Administered 2020-02-14 (×2): 4 ug via INTRAVENOUS

## 2020-02-14 MED ORDER — ONDANSETRON HCL 4 MG/2ML IJ SOLN
INTRAMUSCULAR | Status: DC | PRN
  Administered 2020-02-14: 4 mg via INTRAVENOUS

## 2020-02-14 SURGICAL SUPPLY — 40 items
BAG DRN RND TRDRP ANRFLXCHMBR (UROLOGICAL SUPPLIES) ×3
BAG URINE DRAIN 2000ML AR STRL (UROLOGICAL SUPPLIES) ×5 IMPLANT
BLADE CLIPPER SENSICLIP SURGIC (BLADE) ×5 IMPLANT
CATH FOLEY 2WAY SLVR  5CC 16FR (CATHETERS) ×5
CATH FOLEY 2WAY SLVR 5CC 16FR (CATHETERS) ×3 IMPLANT
CATH ROBINSON RED A/P 16FR (CATHETERS) IMPLANT
CATH ROBINSON RED A/P 20FR (CATHETERS) ×5 IMPLANT
CLOTH BEACON ORANGE TIMEOUT ST (SAFETY) ×5 IMPLANT
CNTNR URN SCR LID CUP LEK RST (MISCELLANEOUS) ×6 IMPLANT
CONT SPEC 4OZ STRL OR WHT (MISCELLANEOUS) ×10
COVER BACK TABLE 60X90IN (DRAPES) ×5 IMPLANT
COVER MAYO STAND STRL (DRAPES) ×5 IMPLANT
DRAPE C-ARM 35X43 STRL (DRAPES) ×5 IMPLANT
DRSG TEGADERM 4X4.75 (GAUZE/BANDAGES/DRESSINGS) ×8 IMPLANT
DRSG TEGADERM 8X12 (GAUZE/BANDAGES/DRESSINGS) ×10 IMPLANT
GAUZE SPONGE 4X4 12PLY STRL (GAUZE/BANDAGES/DRESSINGS) ×2 IMPLANT
GLOVE BIO SURGEON STRL SZ 6.5 (GLOVE) ×4 IMPLANT
GLOVE BIO SURGEON STRL SZ7.5 (GLOVE) ×5 IMPLANT
GLOVE BIO SURGEON STRL SZ8 (GLOVE) IMPLANT
GLOVE BIO SURGEONS STRL SZ 6.5 (GLOVE) ×1
GLOVE SURG ORTHO 8.5 STRL (GLOVE) ×5 IMPLANT
GLOVE SURG SS PI 6.5 STRL IVOR (GLOVE) IMPLANT
GOWN STRL REUS W/TWL LRG LVL3 (GOWN DISPOSABLE) ×5 IMPLANT
HOLDER FOLEY CATH W/STRAP (MISCELLANEOUS) ×5 IMPLANT
I-SEED AGX100 ×132 IMPLANT
IMPL SPACEOAR VUE SYSTEM (Spacer) IMPLANT
IMPLANT SPACEOAR VUE SYSTEM (Spacer) ×5 IMPLANT
IV NS 1000ML (IV SOLUTION) ×5
IV NS 1000ML BAXH (IV SOLUTION) ×3 IMPLANT
KIT TURNOVER CYSTO (KITS) ×5 IMPLANT
MARKER SKIN DUAL TIP RULER LAB (MISCELLANEOUS) ×5 IMPLANT
PACK CYSTO (CUSTOM PROCEDURE TRAY) ×5 IMPLANT
SURGILUBE 2OZ TUBE FLIPTOP (MISCELLANEOUS) ×5 IMPLANT
SUT BONE WAX W31G (SUTURE) IMPLANT
SYR 10ML LL (SYRINGE) ×5 IMPLANT
TOWEL OR 17X26 10 PK STRL BLUE (TOWEL DISPOSABLE) ×5 IMPLANT
TUBE CONNECTING 12'X1/4 (SUCTIONS)
TUBE CONNECTING 12X1/4 (SUCTIONS) IMPLANT
UNDERPAD 30X36 HEAVY ABSORB (UNDERPADS AND DIAPERS) ×10 IMPLANT
WATER STERILE IRR 500ML POUR (IV SOLUTION) ×5 IMPLANT

## 2020-02-14 NOTE — Anesthesia Procedure Notes (Signed)
Procedure Name: LMA Insertion Date/Time: 02/14/2020 12:23 PM Performed by: Gwyndolyn Saxon, CRNA Pre-anesthesia Checklist: Patient identified, Emergency Drugs available, Suction available and Patient being monitored Patient Re-evaluated:Patient Re-evaluated prior to induction Oxygen Delivery Method: Circle system utilized Preoxygenation: Pre-oxygenation with 100% oxygen Induction Type: IV induction Ventilation: Mask ventilation without difficulty LMA: LMA inserted LMA Size: 4.0 Number of attempts: 1 Placement Confirmation: positive ETCO2 and breath sounds checked- equal and bilateral Tube secured with: Tape Dental Injury: Teeth and Oropharynx as per pre-operative assessment

## 2020-02-14 NOTE — Transfer of Care (Signed)
Immediate Anesthesia Transfer of Care Note  Patient: Derrick Lawson  Procedure(s) Performed: RADIOACTIVE SEED IMPLANT/BRACHYTHERAPY IMPLANT (N/A Prostate) SPACE OAR INSTILLATION (N/A Rectum) CYSTOSCOPY FLEXIBLE (Bladder)  Patient Location: PACU  Anesthesia Type:General  Level of Consciousness: drowsy and patient cooperative  Airway & Oxygen Therapy: Patient Spontanous Breathing and Patient connected to face mask oxygen  Post-op Assessment: Report given to RN and Post -op Vital signs reviewed and stable  Post vital signs: Reviewed and stable  Last Vitals:  Vitals Value Taken Time  BP 120/97 02/14/20 1403  Temp    Pulse 93 02/14/20 1409  Resp 13 02/14/20 1409  SpO2 100 % 02/14/20 1409  Vitals shown include unvalidated device data.  Last Pain:  Vitals:   02/14/20 1019  PainSc: 0-No pain      Patients Stated Pain Goal: 3 (26/83/41 9622)  Complications: No complications documented.

## 2020-02-14 NOTE — Anesthesia Postprocedure Evaluation (Signed)
Anesthesia Post Note  Patient: Derrick Lawson  Procedure(s) Performed: RADIOACTIVE SEED IMPLANT/BRACHYTHERAPY IMPLANT (N/A Prostate) SPACE OAR INSTILLATION (N/A Rectum) CYSTOSCOPY FLEXIBLE (Bladder)     Patient location during evaluation: PACU Anesthesia Type: General Level of consciousness: awake Pain management: pain level controlled Vital Signs Assessment: post-procedure vital signs reviewed and stable Respiratory status: spontaneous breathing Cardiovascular status: stable Postop Assessment: no apparent nausea or vomiting Anesthetic complications: no   No complications documented.  Last Vitals:  Vitals:   02/14/20 1415 02/14/20 1430  BP: 119/87 123/90  Pulse: 83 86  Resp: 14 14  Temp:    SpO2: 99% 96%    Last Pain:  Vitals:   02/14/20 1430  PainSc: 0-No pain                 Koy Lamp

## 2020-02-14 NOTE — H&P (Signed)
H&P  Chief Complaint: Prostate cancer  History of Present Illness: Mr. Derrick Lawson is a 69 year old African-American male with high-grade prostate cancer.  He had a PSA of 38-61 and on biopsy had Gleason 3+5, 4+4 and 4+3.  He started long-term ADT on 12/10/2019 with Eligard 45 mg and he presents today for brachytherapy boost prior to 5 weeks of external beam radiation therapy.  He has been well apart from some hot flashes.  He has had no dysuria or gross hematuria.  No fever.  Past Medical History:  Diagnosis Date  . Adhesive capsulitis of right shoulder 01/2020   per pt did get injection of cortisone in shoulder, stated it completed help the pain  . Bilateral renal cysts    simple cyst;  followed by dr Junious Silk  . CKD (chronic kidney disease), stage III ([place])   . 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 DDD (degenerative disc disease), lumbosacral   . History of 2019 novel coronavirus disease (COVID-19) 11/06/2019   positive test results in care everywhere;  per pt asymptomic, had exposure  . History of chronic hepatitis previously seen by [facility] ([place] [facility] in [place]) lov 03-19-2018 in care everywhere pt released to pcp   viral;  hx iv drug use as youth;  approx. 2018 or 2019 completed harvoni for 3 months w/ cure  . Hyperlipidemia   . Hypertension    followed by pcp  . Idiopathic chronic gout of multiple sites with tophus    rheumologist-- dr m. patel  . Lower urinary tract symptoms (LUTS)   . Lumbar stenosis   . Nocturia   . OA (osteoarthritis)   . Prostate cancer [facility]) urologist--- dr Chevelle Coulson/  onologist--- dr Tammi Klippel   dx 08/ 2021,  Stage T1c,  Gleason 4+4   Past Surgical History:  Procedure Laterality Date  . COLONOSCOPY WITH ESOPHAGOGASTRODUODENOSCOPY (EGD)  12-25-2017  dr Cindee Salt  . INGUINAL HERNIA REPAIR Right 07/11/2013   Procedure: RIGHT HERNIA REPAIR INGUINAL ADULT;  Surgeon: Joyice Faster.  Cornett, MD;  Location: [facility];  Service: General;  Laterality: Right;  . INSERTION OF MESH Right 07/11/2013   Procedure: INSERTION OF MESH;  Surgeon: Joyice Faster. Cornett, MD;  Location: [facility];  Service: General;  Laterality: Right;    Home Medications:  Medications Prior to Admission  Medication Sig Dispense Refill Last Dose  . amLODipine (NORVASC) 10 MG tablet Take 10 mg by mouth daily.   02/13/2020 at 1030  . atorvastatin (LIPITOR) 40 MG tablet Take 40 mg by mouth daily.   02/13/2020 at 1030  . febuxostat (ULORIC) 40 MG tablet Take 40 mg by mouth daily.   02/13/2020 at 1030  . colchicine 0.6 MG tablet Take 0.6 mg by mouth every other day.   02/11/2020  . diclofenac Sodium (VOLTAREN) 1 % GEL Apply 2 g topically 4 (four) times daily. (Patient taking differently: Apply 2 g topically 4 (four) times daily as needed.) 100 g 0 01/24/2020 at Unknown time   Allergies: No Known Allergies  Family History  Problem Relation Age of Onset  . Breast cancer Sister   . Prostate cancer Neg Hx   . Colon cancer Neg Hx   . Pancreatic cancer Neg Hx    Social History:  reports that he has never smoked. He has never used smokeless tobacco. He reports that he does not drink alcohol and does not  use drugs.  ROS: A complete review of systems was performed.  All systems are negative except for pertinent findings as noted. Review of Systems  All other systems reviewed and are negative.    Physical Exam:  Vital signs in last 24 hours: Temp:  [97.4 F (36.3 C)] 97.4 F (36.3 C) (12/17 1019) Pulse Rate:  [82] 82 (12/17 1019) Resp:  [16] 16 (12/17 1019) BP: (135)/(100) 135/100 (12/17 1019) SpO2:  [100 %] 100 % (12/17 1019) Weight:  [80.5 kg] 80.5 kg (12/17 1019) General:  Alert and oriented, No acute distress HEENT: Normocephalic, atraumatic Cardiovascular: Regular rate and rhythm Lungs: Regular rate and effort Abdomen: Soft, nontender, nondistended, no abdominal  masses Back: No CVA tenderness Extremities: No edema Neurologic: Grossly intact  Laboratory Data:  No results found for this or any previous visit (from the past 24 hour(s)). Recent Results (from the past 240 hour(s))  SARS CORONAVIRUS 2 (TAT 6-24 HRS) Nasopharyngeal Nasopharyngeal Swab     Status: None   Collection Time: 02/11/20  1:54 PM   Specimen: Nasopharyngeal Swab  Result Value Ref Range Status   SARS Coronavirus 2 NEGATIVE NEGATIVE Final    Comment: (NOTE) SARS-CoV-2 target nucleic acids are NOT DETECTED.  The SARS-CoV-2 RNA is generally detectable in upper and lower respiratory specimens during the acute phase of infection. Negative results do not preclude SARS-CoV-2 infection, do not rule out co-infections with other pathogens, and should not be used as the sole basis for treatment or other patient management decisions. Negative results must be combined with clinical observations, patient history, and epidemiological information. The expected result is Negative.  Fact Sheet for Patients: SugarRoll.be  Fact Sheet for Healthcare Providers: https://www.woods-mathews.com/  This test is not yet approved or cleared by the [place] FDA and  has been authorized for detection and/or diagnosis of SARS-CoV-2 by FDA under an Emergency Use Authorization (EUA). This EUA will remain  in effect (meaning this test can be used) for the duration of the COVID-19 declaration under Se ction 564(b)(1) of the Act, 21 U.S.C. section 360bbb-3(b)(1), unless the authorization is terminated or revoked sooner.  Performed at [facility], [place] [address], [place], [place] [address]    Creatinine: Recent Labs    02/11/20 5449  CREATININE 1.56*    Impression/Assessment:  High risk prostate cancer  Plan:  I discussed with the patient the nature, potential benefits, risks and alternatives to prostate brachytherapy seed implant, SpaceOAR  biodegradable gel insertion, flexible cystoscopy, including side effects of the proposed treatment, the likelihood of the patient achieving the goals of the procedure, and any potential problems that might occur during the procedure or recuperation. All questions answered. Patient elects to proceed.   Festus Aloe 02/14/2020, 11:23 AM

## 2020-02-14 NOTE — Discharge Instructions (Signed)
Brachytherapy for Prostate Cancer, Care After  This sheet gives you information about how to care for yourself after your procedure. Your health care provider may also give you more specific instructions. If you have problems or questions, contact your health care provider. What can I expect after the procedure? After the procedure, it is common to have:  Trouble passing urine.  Blood in the urine or semen.  Constipation.  Frequent feeling of an urgent need to urinate.  Bruising, swelling, and tenderness of the area behind the scrotum (perineum).  Bloating and gas.  Fatigue.  Burning or pain in the rectum.  Problems getting or keeping an erection (erectile dysfunction).  Nausea. Follow these instructions at home: Managing pain, stiffness, and swelling  If directed, apply ice to the affected area: ? Put ice in a plastic bag. ? Place a towel between your skin and the bag. ? Leave the ice on for 20 minutes, 2-3 times a day.  Try not to sit directly on the area behind the scrotum. A soft cushion can help with discomfort. Activity  Do not drive for 24 hours if you were given a medicine to help you relax (sedative).  Do not drive or use heavy machinery while taking prescription pain medicine.  Rest as told by your health care provider.  Most people can return to normal activities a few days or weeks after the procedure. Ask your health care provider what activities are safe for you. Eating and drinking  Drink enough fluid to keep your urine clear or pale yellow.  Eat a healthy, balanced diet. This includes lean proteins, whole grains, and plenty of fruits and vegetables. General instructions  Take over-the-counter and prescription medicines only as told by your health care provider.  Keep all follow-up visits as told by your health care provider. This is important. You may still need additional treatment.  Do not take baths, swim, or use a hot tub until your health  care provider approves. Shower and wash the area behind the scrotum gently.  Do not have sex for one week after the treatment, or until your health care provider approves.  If you have permanent, low-dose brachytherapy implants: ? Limit close contact with children and pregnant women for 2 months or as told by your health care provider. This is important because of the radiation that is still active in the prostate. ? You may set off radioactive sensors, such as airport screenings. Ask your health care provider for a document that explains your treatment. ? You may be instructed to use a condom during sex for the first 2 months after low-dose brachytherapy. Contact a health care provider if:  You have a fever or chills.  You do not have a bowel movement for 3-4 days after the procedure.  You have diarrhea for 3-4 days after the procedure.  You develop any new symptoms, such as problems with urinating or erectile dysfunction.  You have abdomen (abdominal) pain.  You have more blood in your urine. Get help right away if:  You cannot urinate.  There is excessive bleeding from your rectum.  You have unusual drainage coming from your rectum.  You have severe pain in the treated area that does not go away with pain medicine.  You have severe nausea or vomiting. Summary  If you have permanent, low-dose brachytherapy implants, limit close contact with children and pregnant women for 2 months or as told by your health care provider. This is important because of the radiation  that is still active in the prostate.  Talk with your health care provider about your risk of brachytherapy side effects, such as erectile dysfunction or urinary problems. Your health care provider will be able to recommend possible treatment options.  Keep all follow-up visits as told by your health care provider. This is important. You may need additional treatment. This information is not intended to replace  advice given to you by your health care provider. Make sure you discuss any questions you have with your health care provider. Document Revised: 01/27/2017 Document Reviewed: 03/18/2016 Elsevier Patient Education  [place] Instructions  Activity: Get plenty of rest for the remainder of the day. A responsible individual must stay with you for 24 hours following the procedure.  For the next 24 hours, DO NOT: -Drive a car -Paediatric nurse -Drink alcoholic beverages -Take any medication unless instructed by your physician -Make any legal decisions or sign important papers.  Meals: Start with liquid foods such as gelatin or soup. Progress to regular foods as tolerated. Avoid greasy, spicy, heavy foods. If nausea and/or vomiting occur, drink only clear liquids until the nausea and/or vomiting subsides. Call your physician if vomiting continues.  Special Instructions/Symptoms: Your throat may feel dry or sore from the anesthesia or the breathing tube placed in your throat during surgery. If this causes discomfort, gargle with warm salt water. The discomfort should disappear within 24 hours.  If you had a scopolamine patch placed behind your ear for the management of post- operative nausea and/or vomiting:  1. The medication in the patch is effective for 72 hours, after which it should be removed.  Wrap patch in a tissue and discard in the trash. Wash hands thoroughly with soap and water. 2. You may remove the patch earlier than 72 hours if you experience unpleasant side effects which may include dry mouth, dizziness or visual disturbances. 3. Avoid touching the patch. Wash your hands with soap and water after contact with the patch.

## 2020-02-14 NOTE — Op Note (Signed)
Preoperative diagnosis: Stage II (T1cNxMx) Prostate cancer Postoperative diagnosis: Same  Procedure: Prostate brachytherapy seed implant, Cystoscopy, SpaceOAR biodegradable gel insertion  Surgeon: Junious Silk  Radiation oncologist: Tammi Klippel  Anesthesia: Gen.  Indication for procedure: 69 year old with stage II prostate cancer who elected to proceed with prostate brachytherapy.  Findings: On fluoroscopic imaging there was adequate coverage of the prostate. On cystoscopy the urethra appeared normal, the prostatic urethra appeared normal, the trigone and ureteral orifices appeared normal with clear efflux. The bladder mucosa appeared normal. There were no stones, foreign bodies or seeds visualized in the bladder.  Dose: 110 Gy   Description of procedure: After consent was obtained patient brought to the operating room. After adequate anesthesia he is placed in lithotomy position and the transrectal ultrasound probe and perineal template positioned. Catheters and brachytherapy seeds were placed per Dr. Johny Shears plan. A total of 21 catheters and 67 active sources (I-125) were placed. The anchoring needles, template and ultrasound were removed. Scout flouro imaging was obtained of the implant. The Foley was removed.  Another image was obtained.    The 18-gauge needle was then inserted approximately 1 to 2 cm anterior to the anal opening and directed under fluoroscopic guidance into the perirectal fat between the anterior rectal wall and the prostate capsule down to the mid-gland. Midline needle position was confirmed in the sagittal and axial views to verify the tip was in the perirectal fat.  Small amounts of saline were injected to hydrodissect the space between the prostate and the anterior rectal wall.  Axial imaging was viewed to confirm the needle was in the correct location in the mid gland and centered.  Aspiration confirmed no intravascular access.  The saline syringe was carefully  disconnected maintaining the desired needle position and the hydrogel was attached to the needle.  Under ultrasound guidance in the sagittal view a smooth continuous injection was done over about 12 seconds delivering the hydrogel into the space between the prostate and rectal wall.  The needle was withdrawn.   The patient was prepped again and cystoscopy was performed which noted to have a single seed strand in the prostatic urethra. This was pushed into the bladder, grasped and removed intact. Repeat cysto was noted to be normal. He was awakened taken to the recovery room in stable condition.  Complications: None  Blood loss: Minimal  Specimens: None  Drains: none  Disposition: Patient stable to PACU.

## 2020-02-14 NOTE — Anesthesia Preprocedure Evaluation (Signed)
Anesthesia Evaluation  Patient identified by MRN, date of birth, ID band Patient awake    Reviewed: Allergy & Precautions, NPO status , Patient's Chart, lab work & pertinent test results  Airway Mallampati: II  TM Distance: >3 FB     Dental   Pulmonary neg pulmonary ROS,    breath sounds clear to auscultation       Cardiovascular hypertension,  Rhythm:Regular Rate:Normal     Neuro/Psych negative neurological ROS     GI/Hepatic negative GI ROS, (+) Hepatitis -  Endo/Other    Renal/GU Renal disease     Musculoskeletal   Abdominal   Peds  Hematology   Anesthesia Other Findings   Reproductive/Obstetrics                             Anesthesia Physical Anesthesia Plan  ASA: III  Anesthesia Plan: General   Post-op Pain Management:    Induction:   PONV Risk Score and Plan: 2 and Ondansetron, Dexamethasone and Midazolam  Airway Management Planned: Oral ETT  Additional Equipment:   Intra-op Plan:   Post-operative Plan:   Informed Consent: I have reviewed the patients History and Physical, chart, labs and discussed the procedure including the risks, benefits and alternatives for the proposed anesthesia with the patient or authorized representative who has indicated his/her understanding and acceptance.     Dental advisory given  Plan Discussed with: CRNA and Anesthesiologist  Anesthesia Plan Comments:         Anesthesia Quick Evaluation

## 2020-02-16 NOTE — Progress Notes (Signed)
  Radiation Oncology         [phone] ________________________________  Name: VENCENT HAUSCHILD MRN: [id number]  Date: 02/16/2020  DOB: [birth year]       Prostate Seed Implant  [id number], Cherylann Ratel, PA-C  No ref. provider found  DIAGNOSIS:  Oncology History   No history exists.      ICD-10-CM   1. Malignant neoplasm of prostate [facility])  [place] Discharge patient    PROCEDURE: Insertion of radioactive I-125 seeds into the prostate gland.  RADIATION DOSE: 110 Gy, boost therapy.  TECHNIQUE: HELEN CUFF was brought to the operating room with the urologist. He was placed in the dorsolithotomy position. He was catheterized and a rectal tube was inserted. The perineum was shaved, prepped and draped. The ultrasound probe was then introduced into the rectum to see the prostate gland.  TREATMENT DEVICE: A needle grid was attached to the ultrasound probe stand and anchor needles were placed.  3D PLANNING: The prostate was imaged in 3D using a sagittal sweep of the prostate probe. These images were transferred to the planning computer. There, the prostate, urethra and rectum were defined on each axial reconstructed image. Then, the software created an optimized 3D plan and a few seed positions were adjusted. The quality of the plan was reviewed using [facility] information for the target and the following two organs at risk:  Urethra and Rectum.  Then the accepted plan was printed and handed off to the radiation therapist.  Under my supervision, the custom loading of the seeds and spacers was carried out and loaded into sealed vicryl sleeves.  These pre-loaded needles were then placed into the needle holder.[name]  PROSTATE VOLUME STUDY:  Using transrectal ultrasound the volume of the prostate was verified to be 19.6 cc.  SPECIAL TREATMENT PROCEDURE/SUPERVISION AND HANDLING: The pre-loaded needles were then delivered under sagittal guidance. A total of 20 needles were used to deposit 66 seeds in the prostate  gland. The individual seed activity was 0.215 mCi.  SpaceOAR:  Yes  COMPLEX SIMULATION: At the end of the procedure, an anterior radiograph of the pelvis was obtained to document seed positioning and count. Cystoscopy was performed to check the urethra and bladder.  MICRODOSIMETRY: At the end of the procedure, the patient was emitting less than 0.5 mR/hr at 1 meter. Accordingly, he was considered safe for hospital discharge.  PLAN: The patient will return to the radiation oncology clinic for post implant CT dosimetry in three weeks.   ________________________________  Sheral Apley Tammi Klippel, M.D.

## 2020-02-17 ENCOUNTER — Encounter (HOSPITAL_BASED_OUTPATIENT_CLINIC_OR_DEPARTMENT_OTHER): Payer: Self-pay | Admitting: Urology

## 2020-02-17 ENCOUNTER — Telehealth: Payer: Self-pay | Admitting: *Deleted

## 2020-02-17 NOTE — Telephone Encounter (Signed)
RETURNED PATIENT'S PHONE CALL, SPOKE WITH PATIENT. ?

## 2020-02-18 ENCOUNTER — Telehealth: Payer: Self-pay | Admitting: Radiation Oncology

## 2020-02-18 NOTE — Telephone Encounter (Signed)
Received voicemail message from patient requesting a return call about post op care. Patient reports in voicemail he is feeling fine without complications. Phoned patient back. No answer. Left voicemail message explaining that if he experiences difficulty voiding he should contact Dr. Junious Silk. Explained he should adhere to the time and distance restrictions provided to him on the day of his procedure at discharge. Provided my direct number for future questions or needs. Reminded patient of post seed appointment on 02/27/20 at 1300.

## 2020-02-26 ENCOUNTER — Telehealth: Payer: Self-pay | Admitting: *Deleted

## 2020-02-26 NOTE — Telephone Encounter (Signed)
Called patient to remind of sim appt. for 02-27-20- arrival time- 12:45 pm @ [facility], lvm for a return call

## 2020-02-27 ENCOUNTER — Encounter: Payer: Self-pay | Admitting: Radiation Oncology

## 2020-02-27 ENCOUNTER — Ambulatory Visit
Admission: RE | Admit: 2020-02-27 | Discharge: 2020-02-27 | Disposition: A | Discharge status: Home / Self Care | Payer: 59 | Source: Ambulatory Visit | Attending: Radiation Oncology | Admitting: Radiation Oncology

## 2020-02-27 ENCOUNTER — Other Ambulatory Visit: Payer: Self-pay

## 2020-02-27 DIAGNOSIS — C61 Malignant neoplasm of prostate: Secondary | ICD-10-CM | POA: Insufficient documentation

## 2020-02-27 NOTE — Progress Notes (Signed)
  Radiation Oncology         [phone] ________________________________  Name: Derrick Lawson MRN: [id number]  Date: 02/27/2020  DOB: 10-31-1950  COMPLEX SIMULATION NOTE  NARRATIVE:  The patient was brought to the CT Simulation planning suite today following prostate seed implantation approximately one month ago.  Identity was confirmed.  All relevant records and images related to the planned course of therapy were reviewed.  Then, the patient was set-up supine.  CT images were obtained.  The CT images were loaded into the planning software.  Then the prostate and rectum were contoured.  Treatment planning then occurred.  The implanted iodine 125 seeds were identified by the physics staff for projection of radiation distribution  I have requested : 3D Simulation  I have requested a DVH of the following structures: Prostate and rectum.    ________________________________  Artist Pais Kathrynn Running, M.D.

## 2020-02-27 NOTE — Progress Notes (Signed)
  Radiation Oncology         [phone] ________________________________  Name: Derrick Lawson MRN: [id number]  Date: 02/27/2020  DOB: 1950-12-24  SIMULATION AND TREATMENT PLANNING NOTE    ICD-10-CM   1. Malignant neoplasm of prostate ([facility])  C61     DIAGNOSIS:  69 y.o. gentleman with Stage T1c adenocarcinoma of the prostate with Gleason score of 4+4, and PSA of 38.2.  NARRATIVE:  The patient was brought to the CT Simulation planning suite.  Identity was confirmed.  All relevant records and images related to the planned course of therapy were reviewed.  The patient freely provided informed written consent to proceed with treatment after reviewing the details related to the planned course of therapy. The consent form was witnessed and verified by the simulation staff.  Then, the patient was set-up in a stable reproducible supine position for radiation therapy.  A vacuum lock pillow device was custom fabricated to position his legs in a reproducible immobilized position.  Then, I performed a urethrogram under sterile conditions to identify the prostatic apex.  CT images were obtained.  Surface markings were placed.  The CT images were loaded into the planning software.  Then the prostate target and avoidance structures including the rectum, bladder, bowel and hips were contoured.  Treatment planning then occurred.  The radiation prescription was entered and confirmed.  A total of one complex treatment devices were fabricated. I have requested : Intensity Modulated Radiotherapy (IMRT) is medically necessary for this case for the following reason:  Rectal sparing.[name]  PLAN:  The patient will receive 45 Gy in 25 fractions of 1.8 Gy, to supplement an up-front prostate seed implant boost of 110 Gy to achieve a total nominal dose of 155 Gy.  ________________________________  Artist Pais Kathrynn Running, M.D.

## 2020-02-28 NOTE — Progress Notes (Signed)
  Radiation Oncology         [phone] ________________________________  Name: Derrick Lawson MRN: [id number]  Date: 02/27/2020  DOB: 1951/02/14  3D Planning Note   Prostate Brachytherapy Post-Implant Dosimetry  Diagnosis: 69 y.o. gentleman with Stage T1c adenocarcinoma of the prostate with Gleason score of 4+4, and PSA of 38.2  Narrative: On a previous date, Derrick Lawson returned following prostate seed implantation for post implant planning. He underwent CT scan complex simulation to delineate the three-dimensional structures of the pelvis and demonstrate the radiation distribution.  Since that time, the seed localization, and complex isodose planning with dose volume histograms have now been completed.  Results:   Prostate Coverage - The dose of radiation delivered to the 90% or more of the prostate gland (D90) was 90.69% of the prescription dose. This exceeds our goal of greater than 90%. Rectal Sparing - The volume of rectal tissue receiving the prescription dose or higher was 0.0 cc. This falls under our thresholds tolerance of 1.0 cc.  Impression: The prostate seed implant appears to show adequate target coverage and appropriate rectal sparing.  Plan:  The patient will continue to follow with urology for ongoing PSA determinations. I would anticipate a high likelihood for local tumor control with minimal risk for rectal morbidity.  ________________________________  Artist Pais Kathrynn Running, M.D.

## 2020-03-03 DIAGNOSIS — C61 Malignant neoplasm of prostate: Secondary | ICD-10-CM | POA: Diagnosis not present

## 2020-03-05 ENCOUNTER — Telehealth: Payer: Self-pay | Admitting: Radiation Oncology

## 2020-03-05 NOTE — Telephone Encounter (Signed)
Received voicemail message from patient's daughter, Alvino Chapel, requesting a return call. Phoned to inquire. She explains her mother has passed away and she will now be helping her father. She request a overview of his treatment course. Provided overview and answered all questions to the best of my ability. She verbalized understanding of all reviewed and appreciation for call back.

## 2020-03-09 ENCOUNTER — Other Ambulatory Visit: Payer: Self-pay

## 2020-03-09 ENCOUNTER — Ambulatory Visit
Admission: RE | Admit: 2020-03-09 | Discharge: 2020-03-09 | Disposition: A | Discharge status: Home / Self Care | Payer: 59 | Source: Ambulatory Visit | Attending: Radiation Oncology | Admitting: Radiation Oncology

## 2020-03-09 ENCOUNTER — Encounter: Payer: Self-pay | Admitting: Medical Oncology

## 2020-03-09 DIAGNOSIS — C61 Malignant neoplasm of prostate: Secondary | ICD-10-CM | POA: Diagnosis not present

## 2020-03-10 ENCOUNTER — Other Ambulatory Visit: Payer: Self-pay

## 2020-03-10 ENCOUNTER — Ambulatory Visit: Payer: 59

## 2020-03-10 ENCOUNTER — Ambulatory Visit
Admission: RE | Admit: 2020-03-10 | Discharge: 2020-03-10 | Disposition: A | Discharge status: Home / Self Care | Payer: 59 | Source: Ambulatory Visit | Attending: Radiation Oncology | Admitting: Radiation Oncology

## 2020-03-10 DIAGNOSIS — C61 Malignant neoplasm of prostate: Secondary | ICD-10-CM | POA: Diagnosis not present

## 2020-03-11 ENCOUNTER — Ambulatory Visit
Admission: RE | Admit: 2020-03-11 | Discharge: 2020-03-11 | Disposition: A | Discharge status: Home / Self Care | Payer: 59 | Source: Ambulatory Visit | Attending: Radiation Oncology | Admitting: Radiation Oncology

## 2020-03-11 ENCOUNTER — Other Ambulatory Visit: Payer: Self-pay

## 2020-03-11 DIAGNOSIS — C61 Malignant neoplasm of prostate: Secondary | ICD-10-CM | POA: Diagnosis not present

## 2020-03-12 ENCOUNTER — Ambulatory Visit
Admission: RE | Admit: 2020-03-12 | Discharge: 2020-03-12 | Disposition: A | Discharge status: Home / Self Care | Payer: 59 | Source: Ambulatory Visit | Attending: Radiation Oncology | Admitting: Radiation Oncology

## 2020-03-12 DIAGNOSIS — C61 Malignant neoplasm of prostate: Secondary | ICD-10-CM | POA: Diagnosis not present

## 2020-03-13 ENCOUNTER — Other Ambulatory Visit: Payer: Self-pay

## 2020-03-13 ENCOUNTER — Ambulatory Visit
Admission: RE | Admit: 2020-03-13 | Discharge: 2020-03-13 | Disposition: A | Discharge status: Home / Self Care | Payer: 59 | Source: Ambulatory Visit | Attending: Radiation Oncology | Admitting: Radiation Oncology

## 2020-03-13 DIAGNOSIS — C61 Malignant neoplasm of prostate: Secondary | ICD-10-CM | POA: Diagnosis not present

## 2020-03-16 ENCOUNTER — Ambulatory Visit: Payer: 59

## 2020-03-17 ENCOUNTER — Ambulatory Visit: Payer: 59

## 2020-03-18 ENCOUNTER — Ambulatory Visit
Admission: RE | Admit: 2020-03-18 | Discharge: 2020-03-18 | Disposition: A | Discharge status: Home / Self Care | Payer: 59 | Source: Ambulatory Visit | Attending: Radiation Oncology | Admitting: Radiation Oncology

## 2020-03-18 DIAGNOSIS — C61 Malignant neoplasm of prostate: Secondary | ICD-10-CM | POA: Diagnosis not present

## 2020-03-19 ENCOUNTER — Ambulatory Visit
Admission: RE | Admit: 2020-03-19 | Discharge: 2020-03-19 | Disposition: A | Discharge status: Home / Self Care | Payer: 59 | Source: Ambulatory Visit | Attending: Radiation Oncology | Admitting: Radiation Oncology

## 2020-03-19 DIAGNOSIS — C61 Malignant neoplasm of prostate: Secondary | ICD-10-CM | POA: Diagnosis not present

## 2020-03-20 ENCOUNTER — Other Ambulatory Visit: Payer: Self-pay

## 2020-03-20 ENCOUNTER — Ambulatory Visit
Admission: RE | Admit: 2020-03-20 | Discharge: 2020-03-20 | Disposition: A | Discharge status: Home / Self Care | Payer: 59 | Source: Ambulatory Visit | Attending: Radiation Oncology | Admitting: Radiation Oncology

## 2020-03-20 DIAGNOSIS — C61 Malignant neoplasm of prostate: Secondary | ICD-10-CM | POA: Diagnosis not present

## 2020-03-23 ENCOUNTER — Ambulatory Visit
Admission: RE | Admit: 2020-03-23 | Discharge: 2020-03-23 | Disposition: A | Discharge status: Home / Self Care | Payer: 59 | Source: Ambulatory Visit | Attending: Radiation Oncology | Admitting: Radiation Oncology

## 2020-03-23 DIAGNOSIS — C61 Malignant neoplasm of prostate: Secondary | ICD-10-CM | POA: Diagnosis not present

## 2020-03-24 ENCOUNTER — Ambulatory Visit
Admission: RE | Admit: 2020-03-24 | Discharge: 2020-03-24 | Disposition: A | Discharge status: Home / Self Care | Payer: 59 | Source: Ambulatory Visit | Attending: Radiation Oncology | Admitting: Radiation Oncology

## 2020-03-24 DIAGNOSIS — C61 Malignant neoplasm of prostate: Secondary | ICD-10-CM | POA: Diagnosis not present

## 2020-03-25 ENCOUNTER — Ambulatory Visit
Admission: RE | Admit: 2020-03-25 | Discharge: 2020-03-25 | Disposition: A | Discharge status: Home / Self Care | Payer: 59 | Source: Ambulatory Visit | Attending: Radiation Oncology | Admitting: Radiation Oncology

## 2020-03-25 ENCOUNTER — Other Ambulatory Visit: Payer: Self-pay

## 2020-03-25 DIAGNOSIS — C61 Malignant neoplasm of prostate: Secondary | ICD-10-CM | POA: Diagnosis not present

## 2020-03-26 ENCOUNTER — Other Ambulatory Visit: Payer: Self-pay

## 2020-03-26 ENCOUNTER — Ambulatory Visit
Admission: RE | Admit: 2020-03-26 | Discharge: 2020-03-26 | Disposition: A | Discharge status: Home / Self Care | Payer: 59 | Source: Ambulatory Visit | Attending: Radiation Oncology | Admitting: Radiation Oncology

## 2020-03-26 DIAGNOSIS — C61 Malignant neoplasm of prostate: Secondary | ICD-10-CM | POA: Diagnosis not present

## 2020-03-27 ENCOUNTER — Ambulatory Visit
Admission: RE | Admit: 2020-03-27 | Discharge: 2020-03-27 | Disposition: A | Discharge status: Home / Self Care | Payer: 59 | Source: Ambulatory Visit | Attending: Radiation Oncology | Admitting: Radiation Oncology

## 2020-03-27 ENCOUNTER — Other Ambulatory Visit: Payer: Self-pay

## 2020-03-27 DIAGNOSIS — C61 Malignant neoplasm of prostate: Secondary | ICD-10-CM | POA: Diagnosis not present

## 2020-03-30 ENCOUNTER — Ambulatory Visit
Admission: RE | Admit: 2020-03-30 | Discharge: 2020-03-30 | Disposition: A | Discharge status: Home / Self Care | Payer: 59 | Source: Ambulatory Visit | Attending: Radiation Oncology | Admitting: Radiation Oncology

## 2020-03-30 DIAGNOSIS — C61 Malignant neoplasm of prostate: Secondary | ICD-10-CM | POA: Diagnosis not present

## 2020-03-31 ENCOUNTER — Other Ambulatory Visit: Payer: Self-pay

## 2020-03-31 ENCOUNTER — Ambulatory Visit
Admission: RE | Admit: 2020-03-31 | Discharge: 2020-03-31 | Disposition: A | Discharge status: Home / Self Care | Payer: 59 | Source: Ambulatory Visit | Attending: Radiation Oncology | Admitting: Radiation Oncology

## 2020-03-31 DIAGNOSIS — C61 Malignant neoplasm of prostate: Secondary | ICD-10-CM | POA: Insufficient documentation

## 2020-04-01 ENCOUNTER — Other Ambulatory Visit: Payer: Self-pay

## 2020-04-01 ENCOUNTER — Ambulatory Visit
Admission: RE | Admit: 2020-04-01 | Discharge: 2020-04-01 | Disposition: A | Discharge status: Home / Self Care | Payer: 59 | Source: Ambulatory Visit | Attending: Radiation Oncology | Admitting: Radiation Oncology

## 2020-04-01 DIAGNOSIS — C61 Malignant neoplasm of prostate: Secondary | ICD-10-CM | POA: Diagnosis not present

## 2020-04-02 ENCOUNTER — Other Ambulatory Visit: Payer: Self-pay

## 2020-04-02 ENCOUNTER — Ambulatory Visit
Admission: RE | Admit: 2020-04-02 | Discharge: 2020-04-02 | Disposition: A | Discharge status: Home / Self Care | Payer: 59 | Source: Ambulatory Visit | Attending: Radiation Oncology | Admitting: Radiation Oncology

## 2020-04-02 DIAGNOSIS — C61 Malignant neoplasm of prostate: Secondary | ICD-10-CM | POA: Diagnosis not present

## 2020-04-03 ENCOUNTER — Other Ambulatory Visit: Payer: Self-pay

## 2020-04-03 ENCOUNTER — Ambulatory Visit
Admission: RE | Admit: 2020-04-03 | Discharge: 2020-04-03 | Disposition: A | Discharge status: Home / Self Care | Payer: 59 | Source: Ambulatory Visit | Attending: Radiation Oncology | Admitting: Radiation Oncology

## 2020-04-03 DIAGNOSIS — C61 Malignant neoplasm of prostate: Secondary | ICD-10-CM | POA: Diagnosis not present

## 2020-04-06 ENCOUNTER — Other Ambulatory Visit: Payer: Self-pay

## 2020-04-06 ENCOUNTER — Ambulatory Visit
Admission: RE | Admit: 2020-04-06 | Discharge: 2020-04-06 | Disposition: A | Discharge status: Home / Self Care | Payer: 59 | Source: Ambulatory Visit | Attending: Radiation Oncology | Admitting: Radiation Oncology

## 2020-04-06 DIAGNOSIS — C61 Malignant neoplasm of prostate: Secondary | ICD-10-CM | POA: Diagnosis not present

## 2020-04-07 ENCOUNTER — Other Ambulatory Visit: Payer: Self-pay

## 2020-04-07 ENCOUNTER — Ambulatory Visit
Admission: RE | Admit: 2020-04-07 | Discharge: 2020-04-07 | Disposition: A | Discharge status: Home / Self Care | Payer: 59 | Source: Ambulatory Visit | Attending: Radiation Oncology | Admitting: Radiation Oncology

## 2020-04-07 DIAGNOSIS — C61 Malignant neoplasm of prostate: Secondary | ICD-10-CM | POA: Diagnosis not present

## 2020-04-08 ENCOUNTER — Ambulatory Visit
Admission: RE | Admit: 2020-04-08 | Discharge: 2020-04-08 | Disposition: A | Discharge status: Home / Self Care | Payer: 59 | Source: Ambulatory Visit | Attending: Radiation Oncology | Admitting: Radiation Oncology

## 2020-04-08 DIAGNOSIS — C61 Malignant neoplasm of prostate: Secondary | ICD-10-CM | POA: Diagnosis not present

## 2020-04-09 ENCOUNTER — Ambulatory Visit
Admission: RE | Admit: 2020-04-09 | Discharge: 2020-04-09 | Disposition: A | Discharge status: Home / Self Care | Payer: 59 | Source: Ambulatory Visit | Attending: Radiation Oncology | Admitting: Radiation Oncology

## 2020-04-09 DIAGNOSIS — C61 Malignant neoplasm of prostate: Secondary | ICD-10-CM | POA: Diagnosis not present

## 2020-04-10 ENCOUNTER — Ambulatory Visit
Admission: RE | Admit: 2020-04-10 | Discharge: 2020-04-10 | Disposition: A | Discharge status: Home / Self Care | Payer: 59 | Source: Ambulatory Visit | Attending: Radiation Oncology | Admitting: Radiation Oncology

## 2020-04-10 ENCOUNTER — Ambulatory Visit: Payer: 59

## 2020-04-10 DIAGNOSIS — C61 Malignant neoplasm of prostate: Secondary | ICD-10-CM | POA: Diagnosis not present

## 2020-04-13 ENCOUNTER — Ambulatory Visit
Admission: RE | Admit: 2020-04-13 | Discharge: 2020-04-13 | Disposition: A | Discharge status: Home / Self Care | Payer: 59 | Source: Ambulatory Visit | Attending: Radiation Oncology | Admitting: Radiation Oncology

## 2020-04-13 ENCOUNTER — Ambulatory Visit: Payer: 59

## 2020-04-13 DIAGNOSIS — C61 Malignant neoplasm of prostate: Secondary | ICD-10-CM | POA: Diagnosis not present

## 2020-04-14 ENCOUNTER — Ambulatory Visit
Admission: RE | Admit: 2020-04-14 | Discharge: 2020-04-14 | Disposition: A | Discharge status: Home / Self Care | Payer: 59 | Source: Ambulatory Visit | Attending: Radiation Oncology | Admitting: Radiation Oncology

## 2020-04-14 ENCOUNTER — Encounter: Payer: Self-pay | Admitting: Medical Oncology

## 2020-04-14 ENCOUNTER — Encounter: Payer: Self-pay | Admitting: Radiation Oncology

## 2020-04-14 DIAGNOSIS — C61 Malignant neoplasm of prostate: Secondary | ICD-10-CM | POA: Diagnosis not present

## 2020-04-18 ENCOUNTER — Other Ambulatory Visit: Payer: Self-pay

## 2020-04-18 ENCOUNTER — Ambulatory Visit (HOSPITAL_COMMUNITY)
Admission: EM | Admit: 2020-04-18 | Discharge: 2020-04-18 | Disposition: A | Discharge status: Home / Self Care | Payer: 59 | Attending: Physician Assistant | Admitting: Physician Assistant

## 2020-04-18 ENCOUNTER — Telehealth (HOSPITAL_COMMUNITY): Payer: Self-pay | Admitting: Emergency Medicine

## 2020-04-18 ENCOUNTER — Encounter (HOSPITAL_COMMUNITY): Payer: Self-pay | Admitting: Emergency Medicine

## 2020-04-18 DIAGNOSIS — L03116 Cellulitis of left lower limb: Secondary | ICD-10-CM | POA: Diagnosis not present

## 2020-04-18 MED ORDER — CEPHALEXIN 500 MG PO CAPS: 500.0000 mg | ORAL_CAPSULE | Freq: Four times a day (QID) | ORAL | 0 refills | Status: AC

## 2020-04-18 MED ORDER — CEPHALEXIN 500 MG PO CAPS: 500.0000 mg | ORAL_CAPSULE | Freq: Two times a day (BID) | ORAL | 0 refills | Status: DC

## 2020-04-18 NOTE — Telephone Encounter (Signed)
Received message from Derrick Lawson, patient access that patient had called and reported pharmacy said medicine would not be available until 04/22/2020.  ( 4 days from now).  Spoke with Zanette, PA, clarified medication order including quantity.  Called [facility].  Medicine was ready.  Clarified quantity and was told this would be fixed.    Notified patient medications would be ready for pick up  Patient reports he has received 2 messages from pharmacy : 1-cant pick up until 04/22/2020, 2-medicine ready.  Patient will call back if any further issues

## 2020-04-18 NOTE — ED Triage Notes (Signed)
Left foot pain, swelling and redness.  Patient has history of gout.  He reports never having issues like this, this significant.   No known injury.  Patient recently completed radiation therapy for prostate.

## 2020-04-18 NOTE — ED Provider Notes (Signed)
[place]    CSN: [id number] Arrival date & time: 04/18/20  1232      History   Chief Complaint Chief Complaint  Patient presents with  . Foot Pain    HPI Derrick Lawson is a 70 y.o. male.   Pt complains of left foot pain, swelling, and redness that started about 1 week ago.  He has a history of gout, on daily colchicine.  Reports todays sx feel different than typical gout flare up.  Denies fever, chills, n/v/d.  Denies injury or trauma.       Past Medical History:  Diagnosis Date  . Adhesive capsulitis of right shoulder 01/2020   per pt did get injection of cortisone in shoulder, stated it completed help the pain  . Bilateral renal cysts    simple cyst;  followed by dr Junious Silk  . CKD (chronic kidney disease), stage III ([place])   . 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 DDD (degenerative disc disease), lumbosacral   . History of 2019 novel coronavirus disease (COVID-19) 11/06/2019   positive test results in care everywhere;  per pt asymptomic, had exposure  . History of chronic hepatitis previously seen by [facility] ([place] office in [place]) lov 03-19-2018 in care everywhere pt released to pcp   viral;  hx iv drug use as youth;  approx. 2018 or 2019 completed harvoni for 3 months w/ cure  . Hyperlipidemia   . Hypertension    followed by pcp  . Idiopathic chronic gout of multiple sites with tophus    rheumologist-- dr m. patel  . Lower urinary tract symptoms (LUTS)   . Lumbar stenosis   . Nocturia   . OA (osteoarthritis)   . Prostate cancer [facility]) urologist--- dr eskridge/  onologist--- dr Tammi Klippel   dx 08/ 2021,  Stage T1c,  Gleason 4+4    Patient Active Problem List   Diagnosis Date Noted  . Malignant neoplasm of prostate ([place]) 11/14/2019  . 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 Chronic pain disorder 02/07/2018  . Lumbar degenerative  disc disease 02/07/2018  . Lumbar facet joint pain 02/07/2018  . Spondylolisthesis of lumbar region 02/07/2018  . Primary osteoarthritis of left knee 01/22/2018  . Elevated PSA 11/02/2017  . Mixed hyperlipidemia 10/27/2017  . Portal hypertension ([place]) 10/24/2017  . CKD (chronic kidney disease) stage 3, GFR 30-59 ml/min (HCC) 09/19/2017  . Essential hypertension 08/21/2017  . Hepatitis C virus infection cured after antiviral drug therapy 08/21/2017  . Post-operative state 08/02/2013  . Right inguinal hernia 06/14/2013    Past Surgical History:  Procedure Laterality Date  . COLONOSCOPY WITH ESOPHAGOGASTRODUODENOSCOPY (EGD)  12-25-2017  dr Cindee Salt  . CYSTOSCOPY  02/14/2020   Procedure: CYSTOSCOPY FLEXIBLE;  Surgeon: Festus Aloe, MD;  Location: [facility];  Service: Urology;;   ONE SEED FOUND IN BLADDER AND REMOVED  . INGUINAL HERNIA REPAIR Right 07/11/2013   Procedure: RIGHT HERNIA REPAIR INGUINAL ADULT;  Surgeon: Joyice Faster. Cornett, MD;  Location: [place];  Service: General;  Laterality: Right;  . INSERTION OF MESH Right 07/11/2013   Procedure: INSERTION OF MESH;  Surgeon: Joyice Faster. Cornett, MD;  Location: [place];  Service: General;  Laterality: Right;  . RADIOACTIVE SEED IMPLANT N/A 02/14/2020   Procedure: RADIOACTIVE SEED IMPLANT/BRACHYTHERAPY IMPLANT;  Surgeon: Festus Aloe, MD;  Location: [place]  [facility];  Service: Urology;  Laterality: N/A;    66 SEEDS IMPLANTED  . SPACE OAR INSTILLATION N/A 02/14/2020   Procedure: SPACE OAR INSTILLATION;  Surgeon: Festus Aloe, MD;  Location: [facility];  Service: Urology;  Laterality: N/A;       Home Medications    Prior to Admission medications   Medication Sig Start Date End Date Taking? Authorizing Provider  amLODipine (NORVASC) 10 MG tablet Take 10 mg by mouth daily. 10/27/17  Yes [provider]  atorvastatin (LIPITOR) 40 MG tablet Take 40  mg by mouth daily. 10/13/19  Yes [provider]  cephALEXin (KEFLEX) 500 MG capsule Take 1 capsule (500 mg total) by mouth 4 (four) times daily for 10 days. 04/18/20 04/28/20 Yes Hazen Brumett, PA-C  colchicine 0.6 MG tablet Take 0.6 mg by mouth every other day.   Yes [provider]  febuxostat (ULORIC) 40 MG tablet Take 40 mg by mouth daily. 08/26/19  Yes [provider]  cephALEXin (KEFLEX) 500 MG capsule Take 1 capsule (500 mg total) by mouth 2 (two) times daily for 7 days. 04/18/20 04/25/20  Konrad Felix, PA-C  diclofenac Sodium (VOLTAREN) 1 % GEL Apply 2 g topically 4 (four) times daily. Patient taking differently: Apply 2 g topically 4 (four) times daily as needed. 12/28/19   Couture, Cortni S, PA-C  oxyCODONE-acetaminophen (PERCOCET) 7.5-325 MG tablet Take 1 tablet by mouth every 6 (six) hours as needed for severe pain. Patient not taking: Reported on 04/18/2020 02/14/20   Festus Aloe, MD    Family History Family History  Problem Relation Age of Onset  . Breast cancer Sister   . Prostate cancer Neg Hx   . Colon cancer Neg Hx   . Pancreatic cancer Neg Hx     Social History Social History   Tobacco Use  . Smoking status: Never Smoker  . Smokeless tobacco: Never Used  Vaping Use  . Vaping Use: Never used  Substance Use Topics  . Alcohol use: No  . Drug use: No    Comment: hx iv drugs as youth     Allergies   Patient has no known allergies.   Review of Systems Review of Systems  Constitutional: Negative for chills and fever.  HENT: Negative for ear pain and sore throat.   Eyes: Negative for pain and visual disturbance.  Respiratory: Negative for cough and shortness of breath.   Cardiovascular: Negative for chest pain and palpitations.  Gastrointestinal: Negative for abdominal pain and vomiting.  Genitourinary: Negative for dysuria and hematuria.  Musculoskeletal: Positive for joint swelling (left foot swelling). Negative for  arthralgias and back pain.  Skin: Negative for color change and rash.  Neurological: Negative for seizures and syncope.  All other systems reviewed and are negative.    Physical Exam Triage Vital Signs ED Triage Vitals  Enc Vitals Group     BP 04/18/20 1258 (!) 128/92     Pulse Rate 04/18/20 1258 77     Resp 04/18/20 1258 18     Temp 04/18/20 1258 98.1 F (36.7 C)     Temp Source 04/18/20 1258 Oral     SpO2 04/18/20 1258 100 %     Weight --      Height --      Head Circumference --      Peak Flow --      Pain Score 04/18/20 1256 2     Pain Loc --      Pain Edu? --  Excl. in [place]? --    No data found.  Updated Vital Signs BP 115/83 (BP Location: Right Arm) Comment: repositioned  Pulse 77   Temp 98.1 F (36.7 C) (Oral)   Resp 18   SpO2 100%   Visual Acuity Right Eye Distance:   Left Eye Distance:   Bilateral Distance:    Right Eye Near:   Left Eye Near:    Bilateral Near:     Physical Exam Vitals and nursing note reviewed.  Constitutional:      Appearance: He is well-developed and well-nourished.  HENT:     Head: Normocephalic and atraumatic.  Eyes:     Conjunctiva/sclera: Conjunctivae normal.  Cardiovascular:     Rate and Rhythm: Normal rate and regular rhythm.     Heart sounds: No murmur heard.   Pulmonary:     Effort: Pulmonary effort is normal. No respiratory distress.     Breath sounds: Normal breath sounds.  Abdominal:     Palpations: Abdomen is soft.     Tenderness: There is no abdominal tenderness.  Musculoskeletal:        General: No edema.     Cervical back: Neck supple.       Feet:  Skin:    General: Skin is warm and dry.  Neurological:     Mental Status: He is alert.  Psychiatric:        Mood and Affect: Mood and affect normal.      UC Treatments / Results  Labs (all labs ordered are listed, but only abnormal results are displayed) Labs Reviewed - No data to display  EKG   Radiology No results  found.  Procedures Procedures (including critical care time)  Medications Ordered in UC Medications - No data to display  Initial Impression / Assessment and Plan / UC Course  I have reviewed the triage vital signs and the nursing notes.  Pertinent labs & imaging results that were available during my care of the patient were reviewed by me and considered in my medical decision making (see chart for details).     Will treat for cellulitis.  Advised pt to follow up for recheck in 2-3 days.  Advised to return sooner if he experiences increased redness, swelling, fever, chills.  Final Clinical Impressions(s) / UC Diagnoses   Final diagnoses:  Cellulitis of left lower extremity     Discharge Instructions     Take medication as prescribed Follow up in 2-3 days for recheck    ED Prescriptions    Medication Sig Dispense Auth. Provider   cephALEXin (KEFLEX) 500 MG capsule Take 1 capsule (500 mg total) by mouth 4 (four) times daily for 10 days. 20 capsule Burma Ketcher, PA-C   cephALEXin (KEFLEX) 500 MG capsule Take 1 capsule (500 mg total) by mouth 2 (two) times daily for 7 days. 9 capsule Konrad Felix, PA-C     PDMP not reviewed this encounter.   Konrad Felix, PA-C 04/18/20 1324

## 2020-04-18 NOTE — Discharge Instructions (Signed)
Take medication as prescribed Follow up in 2-3 days for recheck

## 2020-04-20 NOTE — Progress Notes (Signed)
  Radiation Oncology         [phone] ________________________________  Name: Derrick Lawson MRN: [id number]  Date: 04/14/2020  DOB: 03-19-1950  End of Treatment Note  Diagnosis:    69 y.o. gentleman with Stage T1c adenocarcinoma of the prostate with Gleason score of 4+4, and PSA of 38.2.     Indication for treatment:  Curative, Definitive Radiotherapy       Radiation treatment dates:    1.  Seed implant 02/14/20 2.  IMRT 1/10-2/15/22  Site/dose:  1. Radioactive seeds were implanted into the prostate for a total of 110 Gy. 2. The prostate, seminal vesicles, and pelvic lymph nodes were boosted with 45 Gy in 25 fractions of 1.8 Gy, for a total dose of 155 Gy  Beams/energy:  1. The radioactive seeds were delivered under sagittal guidance with 3D imaging. 2. The prostate, seminal vesicles, and pelvic lymph nodes were initially treated using helical intensity modulated radiotherapy delivering 6 megavolt photons. Image guidance was performed with megavoltage CT studies prior to each fraction. He was immobilized with a body fix lower extremity mold.   Narrative: The patient tolerated radiation treatment relatively well. The patient experienced some minor urinary irritation and modest fatigue.    Plan: The patient has completed radiation treatment. He will return to radiation oncology clinic for routine followup in one month. I advised him to call or return sooner if he has any questions or concerns related to his recovery or treatment. ________________________________  Sheral Apley. Tammi Klippel, M.D.

## 2020-04-22 ENCOUNTER — Other Ambulatory Visit: Payer: Self-pay

## 2020-04-22 ENCOUNTER — Encounter (HOSPITAL_COMMUNITY): Payer: Self-pay

## 2020-04-22 ENCOUNTER — Ambulatory Visit (HOSPITAL_COMMUNITY)
Admission: RE | Admit: 2020-04-22 | Discharge: 2020-04-22 | Disposition: A | Discharge status: Home / Self Care | Payer: 59 | Source: Ambulatory Visit | Attending: Physician Assistant | Admitting: Physician Assistant

## 2020-04-22 VITALS — BP 117/81 | HR 90 | Temp 98.3°F | Resp 20

## 2020-04-22 DIAGNOSIS — L03116 Cellulitis of left lower limb: Secondary | ICD-10-CM | POA: Diagnosis not present

## 2020-04-22 NOTE — ED Triage Notes (Signed)
Seen 2/19 for left foot pain, redness and swelling. Patient was walking with a cane on 2/19.  Today, patient is walking independent.  Patient is walking better today, but continues to have swelling

## 2020-04-22 NOTE — Discharge Instructions (Signed)
Elevation of the leg, cool compress, compression sock as tolerated. Continue antibiotics. Schedule a follow up with your PCP for next week. If symptoms fully resolve you can cancel this appointment

## 2020-04-22 NOTE — ED Provider Notes (Signed)
[place]    CSN: [id number] Arrival date & time: 04/22/20  1638      History   Chief Complaint Chief Complaint  Patient presents with  . Foot Pain  . Cellulitis    HPI Derrick Lawson is a 71 y.o. male.   HPI  Cellulitis: Patient was seen for left foot cellulitis on 04/18/2020.  He was started on doxycycline and scheduled for follow-up today to ensure he was healing appropriately.  Patient states that he has been improving.  His pain has significantly decreased and he is able to walk on his own whereas when he was last seen he was walking with a cane.  He does continue to have swelling and mild redness but he thinks this may be improving.  No fevers, range of motion of foot has improved, no skin breakdown.  He is tolerating his antibiotic well.  Past Medical History:  Diagnosis Date  . Adhesive capsulitis of right shoulder 01/2020   per pt did get injection of cortisone in shoulder, stated it completed help the pain  . Bilateral renal cysts    simple cyst;  followed by dr Junious Silk  . CKD (chronic kidney disease), stage III ([place])   . 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 DDD (degenerative disc disease), lumbosacral   . History of 2019 novel coronavirus disease (COVID-19) 11/06/2019   positive test results in care everywhere;  per pt asymptomic, had exposure  . History of chronic hepatitis previously seen by [facility] ([place] office in [place]) lov 03-19-2018 in care everywhere pt released to pcp   viral;  hx iv drug use as youth;  approx. 2018 or 2019 completed harvoni for 3 months w/ cure  . Hyperlipidemia   . Hypertension    followed by pcp  . Idiopathic chronic gout of multiple sites with tophus    rheumologist-- dr m. patel  . Lower urinary tract symptoms (LUTS)   . Lumbar stenosis   . Nocturia   . OA (osteoarthritis)   . Prostate cancer [facility]) urologist--- dr  eskridge/  onologist--- dr Tammi Klippel   dx 08/ 2021,  Stage T1c,  Gleason 4+4    Patient Active Problem List   Diagnosis Date Noted  . Malignant neoplasm of prostate ([place]) 11/14/2019  . 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 Chronic pain disorder 02/07/2018  . Lumbar degenerative disc disease 02/07/2018  . Lumbar facet joint pain 02/07/2018  . Spondylolisthesis of lumbar region 02/07/2018  . Primary osteoarthritis of left knee 01/22/2018  . Elevated PSA 11/02/2017  . Mixed hyperlipidemia 10/27/2017  . Portal hypertension ([place]) 10/24/2017  . CKD (chronic kidney disease) stage 3, GFR 30-59 ml/min (HCC) 09/19/2017  . Essential hypertension 08/21/2017  . Hepatitis C virus infection cured after antiviral drug therapy 08/21/2017  . Post-operative state 08/02/2013  . Right inguinal hernia 06/14/2013    Past Surgical History:  Procedure Laterality Date  . COLONOSCOPY WITH ESOPHAGOGASTRODUODENOSCOPY (EGD)  12-25-2017  dr Cindee Salt  . CYSTOSCOPY  02/14/2020   Procedure: CYSTOSCOPY FLEXIBLE;  Surgeon: Festus Aloe, MD;  Location: [facility];  Service: Urology;;   ONE SEED FOUND IN BLADDER AND REMOVED  . INGUINAL HERNIA REPAIR Right 07/11/2013   Procedure: RIGHT HERNIA REPAIR INGUINAL ADULT;  Surgeon: Joyice Faster. Cornett, MD;  Location: [place];  Service: General;  Laterality: Right;  .  INSERTION OF MESH Right 07/11/2013   Procedure: INSERTION OF MESH;  Surgeon: Joyice Faster. Cornett, MD;  Location: [place];  Service: General;  Laterality: Right;  . RADIOACTIVE SEED IMPLANT N/A 02/14/2020   Procedure: RADIOACTIVE SEED IMPLANT/BRACHYTHERAPY IMPLANT;  Surgeon: Festus Aloe, MD;  Location: [facility];  Service: Urology;  Laterality: N/A;    66 SEEDS IMPLANTED  . SPACE OAR INSTILLATION N/A 02/14/2020   Procedure: SPACE OAR INSTILLATION;  Surgeon: Festus Aloe, MD;  Location: [facility];   Service: Urology;  Laterality: N/A;       Home Medications    Prior to Admission medications   Medication Sig Start Date End Date Taking? Authorizing Provider  cephALEXin (KEFLEX) 500 MG capsule Take 1 capsule (500 mg total) by mouth 4 (four) times daily for 10 days. 04/18/20 04/28/20 Yes Zanetto, Jessica, PA-C  amLODipine (NORVASC) 10 MG tablet Take 10 mg by mouth daily. 10/27/17   [provider]  atorvastatin (LIPITOR) 40 MG tablet Take 40 mg by mouth daily. 10/13/19   [provider]  colchicine 0.6 MG tablet Take 0.6 mg by mouth every other day.    [provider]  diclofenac Sodium (VOLTAREN) 1 % GEL Apply 2 g topically 4 (four) times daily. Patient taking differently: Apply 2 g topically 4 (four) times daily as needed. 12/28/19   Couture, Cortni S, PA-C  febuxostat (ULORIC) 40 MG tablet Take 40 mg by mouth daily. 08/26/19   [provider]  oxyCODONE-acetaminophen (PERCOCET) 7.5-325 MG tablet Take 1 tablet by mouth every 6 (six) hours as needed for severe pain. Patient not taking: Reported on 04/18/2020 02/14/20   Festus Aloe, MD    Family History Family History  Problem Relation Age of Onset  . Breast cancer Sister   . Prostate cancer Neg Hx   . Colon cancer Neg Hx   . Pancreatic cancer Neg Hx     Social History Social History   Tobacco Use  . Smoking status: Never Smoker  . Smokeless tobacco: Never Used  Vaping Use  . Vaping Use: Never used  Substance Use Topics  . Alcohol use: No  . Drug use: No    Comment: hx iv drugs as youth     Allergies   Patient has no known allergies.   Review of Systems Review of Systems  As stated above in HPI Physical Exam Triage Vital Signs ED Triage Vitals  Enc Vitals Group     BP 04/22/20 1650 117/81     Pulse Rate 04/22/20 1650 90     Resp 04/22/20 1650 20     Temp 04/22/20 1650 98.3 F (36.8 C)     Temp Source 04/22/20 1650 Oral     SpO2 04/22/20 1650 96 %     Weight --       Height --      Head Circumference --      Peak Flow --      Pain Score 04/22/20 1648 0     Pain Loc --      Pain Edu? --      Excl. in [place]? --    No data found.  Updated Vital Signs BP 117/81 (BP Location: Left Arm)   Pulse 90   Temp 98.3 F (36.8 C) (Oral)   Resp 20   SpO2 96%   Physical Exam Vitals and nursing note reviewed.  Constitutional:      General: He is not in acute distress.  Appearance: Normal appearance. He is not ill-appearing, toxic-appearing or diaphoretic.  Musculoskeletal:        General: Swelling (moderate edema of the left foot that appears to be resolving) present. No tenderness. Normal range of motion.  Skin:    General: Skin is warm.     Findings: Erythema (mild) present.  Neurological:     Mental Status: He is alert.      UC Treatments / Results  Labs (all labs ordered are listed, but only abnormal results are displayed) Labs Reviewed - No data to display  EKG   Radiology No results found.  Procedures Procedures (including critical care time)  Medications Ordered in UC Medications - No data to display  Initial Impression / Assessment and Plan / UC Course  I have reviewed the triage vital signs and the nursing notes.  Pertinent labs & imaging results that were available during my care of the patient were reviewed by me and considered in my medical decision making (see chart for details).     Improving as expected. Continue doxycyline. Continue elevation of foot, cold compress and maybe a compression sock. Follow up in 5 days if swelling has not improved  Final Clinical Impressions(s) / UC Diagnoses   Final diagnoses:  Cellulitis of left lower extremity     Discharge Instructions     Elevation of the leg, cool compress, compression sock as tolerated. Continue antibiotics. Schedule a follow up with your PCP for next week. If symptoms fully resolve you can cancel this appointment    ED Prescriptions    None     PDMP not  reviewed this encounter.   Hughie Closs, [place] 04/22/20 1712

## 2020-05-13 NOTE — Progress Notes (Addendum)
Patient is here today for a follow-up appointment. Patient reports: Dysuria None Hematuria None Nocturia 2-3  Leakage  Unsure  Urgency Sometime Emptying bladder Sometime  Push or strain to start stream States that he has to strain sometime Bowels  No issues Next appointment with urologist In April unsure of date IPPS 13

## 2020-05-14 ENCOUNTER — Ambulatory Visit
Admission: RE | Admit: 2020-05-14 | Discharge: 2020-05-14 | Disposition: A | Discharge status: Home / Self Care | Payer: 59 | Source: Ambulatory Visit | Attending: Radiation Oncology | Admitting: Radiation Oncology

## 2020-05-14 ENCOUNTER — Other Ambulatory Visit: Payer: Self-pay

## 2020-05-14 DIAGNOSIS — C61 Malignant neoplasm of prostate: Secondary | ICD-10-CM | POA: Insufficient documentation

## 2020-05-14 NOTE — Progress Notes (Signed)
Radiation Oncology         [phone] ________________________________  Name: GALVIN AVERSA MRN: [id number]  Date: 05/14/2020  DOB: 27-Apr-1950  Post Treatment Note  CC: Juanda Chance  Mindi Curling, PA-C  Diagnosis:   70 y.o. gentleman with Stage T1c adenocarcinoma of the prostate with Gleason score of 4+4, and PSA of 38.2.  Interval Since Last Radiation:  4.5 weeks, concurrent with LT-ADT- started 12/09/20 1.  Brachytherapy 02/14/20:  Radioactive seeds were implanted into the prostate for a total of 110 Gy. 2.  IMRT 1/10-2/15/22: The prostate, seminal vesicles, and pelvic lymph nodes were boosted with 45 Gy in 25 fractions of 1.8 Gy, for a total dose of 155 Gy  Narrative:  The patient returns today for routine follow-up.  He tolerated radiation treatment relatively well with only minor urinary irritation and modest fatigue. He did experience some mild dysuria, hesitancy, increased frequency, urgency and nocturia x2-3/night. He specifically denied gross hematuria, incomplete emptying, intermittency, fever, chills or night sweats.  He did not experience abdominal pain, N/V, diarrhea or constipation but did note looser BMs.                            On review of systems, the patient states that he is doing well in general.  He continues with some occasional hesitancy and nocturia 2-3 times per night but specifically denies ongoing dysuria, excessive daytime frequency, urgency or intermittency, and reports a strong stream.  He feels that he empties his bladder well on voiding most of the time and denies incontinence.  His current IPSS score is 13, indicating mild to moderate urinary issues.  He has continued taking Flomax daily as prescribed.  He denies any residual bowel issues or abdominal pain and reports a healthy appetite, maintaining his weight.  He has continued to tolerate the ADT fairly well and denies any significant effect on his energy level.  He has remained active and,  overall, he is quite pleased with his progress to date.  ALLERGIES:  has No Known Allergies.  Meds: Current Outpatient Medications  Medication Sig Dispense Refill  . amLODipine (NORVASC) 10 MG tablet Take 10 mg by mouth daily.    [name] atorvastatin (LIPITOR) 40 MG tablet Take 40 mg by mouth daily.    . febuxostat (ULORIC) 40 MG tablet Take 40 mg by mouth daily.    [name] glipiZIDE (GLUCOTROL) 5 MG tablet Take by mouth.    . oxyCODONE-acetaminophen (PERCOCET) 7.5-325 MG tablet Take 1 tablet by mouth every 6 (six) hours as needed for severe pain. 10 tablet 0  . tamsulosin (FLOMAX) 0.4 MG CAPS capsule Take by mouth.    . colchicine 0.6 MG tablet Take 0.6 mg by mouth every other day. (Patient not taking: Reported on 05/13/2020)    . diclofenac Sodium (VOLTAREN) 1 % GEL Apply 2 g topically 4 (four) times daily. (Patient not taking: Reported on 05/13/2020) 100 g 0   No current facility-administered medications for this encounter.    Physical Findings:  vitals were not taken for this visit.   /Unable to assess due to telephone follow-up visit format.  Lab Findings: Lab Results  Component Value Date   WBC 6.9 02/11/2020   HGB 11.7 (L) 02/11/2020   HCT 36.5 (L) 02/11/2020   MCV 94.3 02/11/2020   PLT 179 02/11/2020     Radiographic Findings: No results found.  Impression/Plan: 1. 70 y.o. gentleman with Stage  T1c adenocarcinoma of the prostate with Gleason score of 4+4, and PSA of 38.2. He will continue to follow up with urology for ongoing PSA determinations and has an appointment scheduled with Dr. Junious Silk in April 2022 when he will be due for his next ADT injection. He understands what to expect with regards to PSA monitoring going forward.  He has continued to tolerate the ADT well and anticipates completing a full 18 to 32-month course under the care and direction of Dr. Junious Silk.  I will look forward to following his response to treatment via correspondence with urology, and would be happy to  continue to participate in his care if clinically indicated. I talked to the patient about what to expect in the future, including his risk for erectile dysfunction and rectal bleeding. I encouraged him to call or return to the office if he has any questions regarding his previous radiation or possible radiation side effects. He was comfortable with this plan and will follow up as needed.     Nicholos Johns, PA-C

## 2020-05-17 ENCOUNTER — Emergency Department (HOSPITAL_COMMUNITY): Payer: 59

## 2020-05-17 ENCOUNTER — Emergency Department (HOSPITAL_COMMUNITY)
Admission: EM | Admit: 2020-05-17 | Discharge: 2020-05-18 | Disposition: A | Discharge status: Home / Self Care | Payer: 59 | Attending: Emergency Medicine | Admitting: Emergency Medicine

## 2020-05-17 ENCOUNTER — Encounter (HOSPITAL_COMMUNITY): Payer: Self-pay

## 2020-05-17 ENCOUNTER — Other Ambulatory Visit: Payer: Self-pay

## 2020-05-17 DIAGNOSIS — Z79899 Other long term (current) drug therapy: Secondary | ICD-10-CM | POA: Insufficient documentation

## 2020-05-17 DIAGNOSIS — Z8546 Personal history of malignant neoplasm of prostate: Secondary | ICD-10-CM | POA: Insufficient documentation

## 2020-05-17 DIAGNOSIS — Z8616 Personal history of COVID-19: Secondary | ICD-10-CM | POA: Insufficient documentation

## 2020-05-17 DIAGNOSIS — I129 Hypertensive chronic kidney disease with stage 1 through stage 4 chronic kidney disease, or unspecified chronic kidney disease: Secondary | ICD-10-CM | POA: Insufficient documentation

## 2020-05-17 DIAGNOSIS — M25472 Effusion, left ankle: Secondary | ICD-10-CM

## 2020-05-17 DIAGNOSIS — L03116 Cellulitis of left lower limb: Secondary | ICD-10-CM | POA: Diagnosis not present

## 2020-05-17 DIAGNOSIS — N183 Chronic kidney disease, stage 3 unspecified: Secondary | ICD-10-CM | POA: Diagnosis not present

## 2020-05-17 NOTE — ED Triage Notes (Signed)
Pt reports being diagnosed with cellulitis 1 month ago on left foot at [facility]. Seen PCP and was given abx. Pt c/o infection/ slight swelling that has not cleared up all the way.

## 2020-05-17 NOTE — ED Provider Notes (Signed)
[facility] DEPT Provider Note   CSN: [id number] Arrival date & time: 05/17/20  2213     History No chief complaint on file.   Derrick Lawson is a 70 y.o. male.  Patient with a history of CKD, hyperglycemia, hypertension, gout presenting with a 1 month history of left foot and ankle swelling.  He denies any fall or trauma.  States he was treated by both urgent care and PCP with 2 courses of antibiotics including Keflex and doxycycline.  He finished the last course of antibiotics about a week ago.  Initially thought this pain and swelling was improving but now it is returning again.  He has increased with weightbearing.  There is been no fever.  No nausea or vomiting.  No abdominal pain.  Denies any feelings of general sickness. He has had gout in the past but never in this ankle joint. Denies any history of blood clot.  Denies any history of trauma or IV drug abuse. No chest pain or shortness of breath  The history is provided by the patient.       Past Medical History:  Diagnosis Date  . Adhesive capsulitis of right shoulder 01/2020   per pt did get injection of cortisone in shoulder, stated it completed help the pain  . Bilateral renal cysts    simple cyst;  followed by dr Junious Silk  . CKD (chronic kidney disease), stage III ([place])   . 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 DDD (degenerative disc disease), lumbosacral   . History of 2019 novel coronavirus disease (COVID-19) 11/06/2019   positive test results in care everywhere;  per pt asymptomic, had exposure  . History of chronic hepatitis previously seen by [facility] ([place] office in [place]) lov 03-19-2018 in care everywhere pt released to pcp   viral;  hx iv drug use as youth;  approx. 2018 or 2019 completed harvoni for 3 months w/ cure  . Hyperlipidemia   . Hypertension    followed by pcp  . Idiopathic  chronic gout of multiple sites with tophus    rheumologist-- dr m. patel  . Lower urinary tract symptoms (LUTS)   . Lumbar stenosis   . Nocturia   . OA (osteoarthritis)   . Prostate cancer [facility]) urologist--- dr eskridge/  onologist--- dr Tammi Klippel   dx 08/ 2021,  Stage T1c,  Gleason 4+4    Patient Active Problem List   Diagnosis Date Noted  . Malignant neoplasm of prostate ([place]) 11/14/2019  . 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 Chronic pain disorder 02/07/2018  . Lumbar degenerative disc disease 02/07/2018  . Lumbar facet joint pain 02/07/2018  . Spondylolisthesis of lumbar region 02/07/2018  . Primary osteoarthritis of left knee 01/22/2018  . Elevated PSA 11/02/2017  . Mixed hyperlipidemia 10/27/2017  . Portal hypertension ([place]) 10/24/2017  . CKD (chronic kidney disease) stage 3, GFR 30-59 ml/min ([facility]) 09/19/2017  . Essential hypertension 08/21/2017  . Hepatitis C virus infection cured after antiviral drug therapy 08/21/2017  . Post-operative state 08/02/2013  . Right inguinal hernia 06/14/2013    Past Surgical History:  Procedure Laterality Date  . COLONOSCOPY WITH ESOPHAGOGASTRODUODENOSCOPY (EGD)  12-25-2017  dr Cindee Salt  . CYSTOSCOPY  02/14/2020   Procedure: CYSTOSCOPY FLEXIBLE;  Surgeon: Festus Aloe, MD;  Location: [facility];  Service: Urology;;   ONE SEED FOUND IN  BLADDER AND REMOVED  . INGUINAL HERNIA REPAIR Right 07/11/2013   Procedure: RIGHT HERNIA REPAIR INGUINAL ADULT;  Surgeon: Joyice Faster. Cornett, MD;  Location: [place];  Service: General;  Laterality: Right;  . INSERTION OF MESH Right 07/11/2013   Procedure: INSERTION OF MESH;  Surgeon: Joyice Faster. Cornett, MD;  Location: [place];  Service: General;  Laterality: Right;  . RADIOACTIVE SEED IMPLANT N/A 02/14/2020   Procedure: RADIOACTIVE SEED IMPLANT/BRACHYTHERAPY IMPLANT;  Surgeon: Festus Aloe, MD;  Location: [facility];  Service: Urology;  Laterality: N/A;    67 SEEDS IMPLANTED  . SPACE OAR INSTILLATION N/A 02/14/2020   Procedure: SPACE OAR INSTILLATION;  Surgeon: Festus Aloe, MD;  Location: [facility];  Service: Urology;  Laterality: N/A;       Family History  Problem Relation Age of Onset  . Breast cancer Sister   . Prostate cancer Neg Hx   . Colon cancer Neg Hx   . Pancreatic cancer Neg Hx     Social History   Tobacco Use  . Smoking status: Never Smoker  . Smokeless tobacco: Never Used  Vaping Use  . Vaping Use: Never used  Substance Use Topics  . Alcohol use: No  . Drug use: No    Comment: hx iv drugs as youth    Home Medications Prior to Admission medications   Medication Sig Start Date End Date Taking? Authorizing Provider  amLODipine (NORVASC) 10 MG tablet Take 10 mg by mouth daily. 10/27/17   [provider]  atorvastatin (LIPITOR) 40 MG tablet Take 40 mg by mouth daily. 10/13/19   [provider]  colchicine 0.6 MG tablet Take 0.6 mg by mouth every other day. Patient not taking: Reported on 05/13/2020    [provider]  diclofenac Sodium (VOLTAREN) 1 % GEL Apply 2 g topically 4 (four) times daily. Patient not taking: Reported on 05/13/2020 12/28/19   Couture, Cortni S, PA-C  febuxostat (ULORIC) 40 MG tablet Take 40 mg by mouth daily. 08/26/19   [provider]  glipiZIDE (GLUCOTROL) 5 MG tablet Take by mouth. 04/29/20   [provider]  oxyCODONE-acetaminophen (PERCOCET) 7.5-325 MG tablet Take 1 tablet by mouth every 6 (six) hours as needed for severe pain. 02/14/20   Festus Aloe, MD  tamsulosin (FLOMAX) 0.4 MG CAPS capsule Take by mouth. 04/29/20   [provider]    Allergies    Patient has no known allergies.  Review of Systems   Review of Systems  Constitutional: Negative for activity change, appetite change, fatigue and fever.  HENT: Negative for congestion and postnasal drip.    Respiratory: Negative for cough, chest tightness and shortness of breath.   Cardiovascular: Positive for leg swelling. Negative for chest pain.  Gastrointestinal: Negative for abdominal pain, nausea and vomiting.  Genitourinary: Negative for dysuria and hematuria.  Musculoskeletal: Positive for arthralgias and myalgias. Negative for back pain.  Skin: Positive for wound.  Neurological: Negative for dizziness, weakness and headaches.    all other systems are negative except as noted in the HPI and PMH.   Physical Exam Updated Vital Signs BP 124/90 (BP Location: Right Arm)   Pulse (!) 109   Temp 98.9 F (37.2 C) (Oral)   Resp 18   Ht 5' 8"  (1.727 m)   Wt 79.4 kg   SpO2 98%   BMI 26.61 kg/m   Physical Exam Vitals and nursing note reviewed.  Constitutional:  General: He is not in acute distress.    Appearance: He is well-developed.  HENT:     Head: Normocephalic and atraumatic.     Mouth/Throat:     Pharynx: No oropharyngeal exudate.  Eyes:     Conjunctiva/sclera: Conjunctivae normal.     Pupils: Pupils are equal, round, and reactive to light.  Neck:     Comments: No meningismus. Cardiovascular:     Rate and Rhythm: Normal rate and regular rhythm.     Heart sounds: Normal heart sounds. No murmur heard.   Pulmonary:     Effort: Pulmonary effort is normal. No respiratory distress.     Breath sounds: Normal breath sounds.  Abdominal:     Palpations: Abdomen is soft.     Tenderness: There is no abdominal tenderness. There is no guarding or rebound.  Musculoskeletal:        General: Swelling and tenderness present.     Cervical back: Normal range of motion and neck supple.     Left lower leg: Edema present.     Comments: There is diffuse swelling about the left ankle and foot.  It is mildly warm and erythematous.  There is no calf swelling or tenderness.  Able to flex and extend ankle and knee without difficulty.  Able to wiggle toes.  Intact DP and PT pulses.   Compartments are soft.  No calf tenderness  Skin:    General: Skin is warm.  Neurological:     Mental Status: He is alert and oriented to person, place, and time.     Cranial Nerves: No cranial nerve deficit.     Motor: No abnormal muscle tone.     Coordination: Coordination normal.     Comments: No ataxia on finger to nose bilaterally. No pronator drift. 5/5 strength throughout. CN 2-12 intact.Equal grip strength. Sensation intact.   Psychiatric:        Behavior: Behavior normal.     ED Results / Procedures / Treatments   Labs (all labs ordered are listed, but only abnormal results are displayed) Labs Reviewed  CBC WITH DIFFERENTIAL/PLATELET - Abnormal; Notable for the following components:      Result Value   RBC 2.85 (*)    Hemoglobin 8.9 (*)    HCT 27.7 (*)    Platelets 141 (*)    Lymphs Abs 0.3 (*)    All other components within normal limits  COMPREHENSIVE METABOLIC PANEL - Abnormal; Notable for the following components:   BUN 24 (*)    Creatinine, Ser 1.67 (*)    GFR, Estimated 44 (*)    All other components within normal limits  SEDIMENTATION RATE - Abnormal; Notable for the following components:   Sed Rate 70 (*)    All other components within normal limits  C-REACTIVE PROTEIN - Abnormal; Notable for the following components:   CRP 1.0 (*)    All other components within normal limits  D-DIMER, QUANTITATIVE - Abnormal; Notable for the following components:   D-Dimer, Quant 2.57 (*)    All other components within normal limits  URIC ACID  POC OCCULT BLOOD, ED    EKG None  Radiology DG Ankle Complete Left  Result Date: 05/17/2020 CLINICAL DATA:  Redness, swelling EXAM: LEFT ANKLE COMPLETE - 3+ VIEW COMPARISON:  None. FINDINGS: Lateral soft tissue swelling at the ankle. No acute bony abnormality. Specifically, no fracture, subluxation, or dislocation. IMPRESSION: No acute bony abnormality. Electronically Signed   By: Rolm Baptise M.D.   On:  05/17/2020 23:35    DG Foot Complete Left  Result Date: 05/17/2020 CLINICAL DATA:  Redness, swelling EXAM: LEFT FOOT - COMPLETE 3+ VIEW COMPARISON:  None. FINDINGS: Lucency in the distal aspect of the left great toe proximal phalanx concerning for fracture. Correlate clinically for pain in this area. Early degenerative changes at the 1st MTP joint. No subluxation or dislocation. IMPRESSION: Lucency in the distal aspect of the left great toe proximal phalanx concerning for fracture Electronically Signed   By: Rolm Baptise M.D.   On: 05/17/2020 23:34    Procedures Procedures   Medications Ordered in ED Medications - No data to display  ED Course  I have reviewed the triage vital signs and the nursing notes.  Pertinent labs & imaging results that were available during my care of the patient were reviewed by me and considered in my medical decision making (see chart for details).    MDM Rules/Calculators/A&P                         1 month of left lower extremity redness, pain and swelling.  Treated x2 for cellulitis with minimal improvement.  Patient appears well and nontoxic.   We will evaluate with x-rays, labs. Concerned this could represent gout versus possibly DVT.  Cellulitis seems less likely given failure of antibiotics.  X-ray shows lucency in the left great toe proximal phalanx concerning for fracture.  Otherwise negative.  Hemoglobin is 8.9 from 11 in December.  Patient denies any dark or bloody stools.  He states this is likely low due to his radiation treatment he completed in February for his prostate cancer. Hemoccult is negative.  Creatinine is at baseline.  Uric acid is normal. ESR and CRP mildly elevated.  Patient will be treated again for cellulitis with course of antibiotics.  This also may represent gout and he will be given a course of steroids. He is also taking colchicine at home. His kidney function precludes the use of NSAIDs.  He will be placed in a fracture boot.  Venous  Doppler will be ordered for tomorrow.  Denies any chest pain or shortness of breath.  Low suspicion for pulmonary embolism.  Elevation, antibiotics, steroids, followup for doppler study.  Return to the ED with spreading redness, worsening pain, fever, chest pain, shortness of breath, or any other concerns.  Final Clinical Impression(s) / ED Diagnoses Final diagnoses:  Left ankle swelling  Cellulitis of left leg    Rx / DC Orders ED Discharge Orders    None       Rancour, Annie Main, MD 05/18/20 [id number]

## 2020-05-18 ENCOUNTER — Ambulatory Visit (HOSPITAL_BASED_OUTPATIENT_CLINIC_OR_DEPARTMENT_OTHER)
Admission: RE | Admit: 2020-05-18 | Discharge: 2020-05-18 | Disposition: A | Discharge status: Home / Self Care | Payer: 59 | Source: Ambulatory Visit | Attending: Emergency Medicine | Admitting: Emergency Medicine

## 2020-05-18 DIAGNOSIS — L539 Erythematous condition, unspecified: Secondary | ICD-10-CM | POA: Insufficient documentation

## 2020-05-18 DIAGNOSIS — L0390 Cellulitis, unspecified: Secondary | ICD-10-CM

## 2020-05-18 DIAGNOSIS — M79605 Pain in left leg: Secondary | ICD-10-CM | POA: Insufficient documentation

## 2020-05-18 DIAGNOSIS — M7989 Other specified soft tissue disorders: Secondary | ICD-10-CM | POA: Insufficient documentation

## 2020-05-18 LAB — CBC WITH DIFFERENTIAL/PLATELET
Abs Immature Granulocytes: 0.03 10*3/uL (ref 0.00–0.07)
Basophils Absolute: 0 10*3/uL (ref 0.0–0.1)
Basophils Relative: 0 %
Eosinophils Absolute: 0.1 10*3/uL (ref 0.0–0.5)
Eosinophils Relative: 2 %
HCT: 27.7 % — ABNORMAL LOW (ref 39.0–52.0)
Hemoglobin: 8.9 g/dL — ABNORMAL LOW (ref 13.0–17.0)
Immature Granulocytes: 1 %
Lymphocytes Relative: 5 %
Lymphs Abs: 0.3 10*3/uL — ABNORMAL LOW (ref 0.7–4.0)
MCH: 31.2 pg (ref 26.0–34.0)
MCHC: 32.1 g/dL (ref 30.0–36.0)
MCV: 97.2 fL (ref 80.0–100.0)
Monocytes Absolute: 0.8 10*3/uL (ref 0.1–1.0)
Monocytes Relative: 14 %
Neutro Abs: 4.7 10*3/uL (ref 1.7–7.7)
Neutrophils Relative %: 78 %
Platelets: 141 10*3/uL — ABNORMAL LOW (ref 150–400)
RBC: 2.85 MIL/uL — ABNORMAL LOW (ref 4.22–5.81)
RDW: 13.7 % (ref 11.5–15.5)
WBC: 6 10*3/uL (ref 4.0–10.5)
nRBC: 0 % (ref 0.0–0.2)

## 2020-05-18 LAB — COMPREHENSIVE METABOLIC PANEL
ALT: 16 U/L (ref 0–44)
AST: 20 U/L (ref 15–41)
Albumin: 3.5 g/dL (ref 3.5–5.0)
Alkaline Phosphatase: 66 U/L (ref 38–126)
Anion gap: 10 (ref 5–15)
BUN: 24 mg/dL — ABNORMAL HIGH (ref 8–23)
CO2: 23 mmol/L (ref 22–32)
Calcium: 9.4 mg/dL (ref 8.9–10.3)
Chloride: 108 mmol/L (ref 98–111)
Creatinine, Ser: 1.67 mg/dL — ABNORMAL HIGH (ref 0.61–1.24)
GFR, Estimated: 44 mL/min — ABNORMAL LOW (ref >60)
Glucose, Bld: 87 mg/dL (ref 70–99)
Potassium: 3.7 mmol/L (ref 3.5–5.1)
Sodium: 141 mmol/L (ref 135–145)
Total Bilirubin: 0.5 mg/dL (ref 0.3–1.2)
Total Protein: 7.1 g/dL (ref 6.5–8.1)

## 2020-05-18 LAB — SEDIMENTATION RATE: Sed Rate: 70 mm/hr — ABNORMAL HIGH (ref 0–16)

## 2020-05-18 LAB — D-DIMER, QUANTITATIVE: D-Dimer, Quant: 2.57 ug/mL-FEU — ABNORMAL HIGH (ref 0.00–0.50)

## 2020-05-18 LAB — URIC ACID: Uric Acid, Serum: 4 mg/dL (ref 3.7–8.6)

## 2020-05-18 LAB — POC OCCULT BLOOD, ED: Fecal Occult Bld: NEGATIVE

## 2020-05-18 LAB — C-REACTIVE PROTEIN: CRP: 1 mg/dL — ABNORMAL HIGH (ref <1.0)

## 2020-05-18 MED ORDER — ENOXAPARIN SODIUM 80 MG/0.8ML ~~LOC~~ SOLN
1.0000 mg/kg | Freq: Once | SUBCUTANEOUS | Status: AC
  Administered 2020-05-18: 80 mg via SUBCUTANEOUS
  Filled 2020-05-18: qty 0.8

## 2020-05-18 MED ORDER — DOXYCYCLINE HYCLATE 100 MG PO CAPS: 100.0000 mg | ORAL_CAPSULE | Freq: Two times a day (BID) | ORAL | 0 refills | Status: AC

## 2020-05-18 MED ORDER — PREDNISONE 50 MG PO TABS: ORAL_TABLET | ORAL | 0 refills | Status: DC

## 2020-05-18 NOTE — Progress Notes (Signed)
Left lower extremity venous study completed.     Please see CV Proc for preliminary results.   Vonzell Schlatter, RVT

## 2020-05-18 NOTE — ED Notes (Signed)
Pt a&ox4, vitals stable, call light in reach, side rails upx2.  Pillow provided per pt request.

## 2020-05-18 NOTE — ED Notes (Signed)
Pt a&ox4, vitals stable, call light in reach, side rails upx2.  Warm blankets provided.

## 2020-05-18 NOTE — ED Notes (Signed)
Pt a&ox4, vitals stable, call light in reach, side rails upx2.

## 2020-05-18 NOTE — Discharge Instructions (Addendum)
Take the antibiotics and steroids as prescribed.  Return tomorrow for an ultrasound of your lower leg to rule out blood clot.  Keep the leg elevated.  Return to the ED with difficulty breathing, chest pain, any other concerns.

## 2020-05-18 NOTE — ED Notes (Signed)
CAM boot applied

## 2020-06-03 ENCOUNTER — Other Ambulatory Visit: Payer: Self-pay

## 2020-06-03 ENCOUNTER — Ambulatory Visit (INDEPENDENT_AMBULATORY_CARE_PROVIDER_SITE_OTHER): Payer: 59

## 2020-06-03 ENCOUNTER — Ambulatory Visit: Payer: 59 | Admitting: Podiatry

## 2020-06-03 DIAGNOSIS — M778 Other enthesopathies, not elsewhere classified: Secondary | ICD-10-CM

## 2020-06-03 DIAGNOSIS — M79671 Pain in right foot: Secondary | ICD-10-CM

## 2020-06-03 DIAGNOSIS — R600 Localized edema: Secondary | ICD-10-CM

## 2020-06-03 DIAGNOSIS — M1A0790 Idiopathic chronic gout, unspecified ankle and foot, without tophus (tophi): Secondary | ICD-10-CM

## 2020-06-03 DIAGNOSIS — M79672 Pain in left foot: Secondary | ICD-10-CM | POA: Diagnosis not present

## 2020-06-03 DIAGNOSIS — M7751 Other enthesopathy of right foot: Secondary | ICD-10-CM

## 2020-06-03 DIAGNOSIS — M7752 Other enthesopathy of left foot: Secondary | ICD-10-CM | POA: Diagnosis not present

## 2020-06-03 MED ORDER — TRIAMCINOLONE ACETONIDE 10 MG/ML IJ SUSP
10.0000 mg | Freq: Once | INTRAMUSCULAR | Status: AC
  Administered 2020-06-03: 10 mg

## 2020-06-04 ENCOUNTER — Other Ambulatory Visit: Payer: Self-pay | Admitting: Podiatry

## 2020-06-04 DIAGNOSIS — M7751 Other enthesopathy of right foot: Secondary | ICD-10-CM

## 2020-06-04 DIAGNOSIS — M7752 Other enthesopathy of left foot: Secondary | ICD-10-CM

## 2020-06-04 NOTE — Progress Notes (Signed)
Subjective:   Patient ID: Derrick Lawson, male   DOB: 70 y.o.   MRN: [id number]   HPI Patient presents stating he has developed around his pain around his right forefoot and he has swelling in both of his feet with tentative diagnosis of gout and did have a check for DVT which was negative.  Patient does not smoke states the right foot makes it hard for him to walk and would like to be active   Review of Systems  All other systems reviewed and are negative.       Objective:  Physical Exam Vitals and nursing note reviewed.  Constitutional:      Appearance: He is well-developed.  Pulmonary:     Effort: Pulmonary effort is normal.  Musculoskeletal:        General: Normal range of motion.  Skin:    General: Skin is warm.  Neurological:     Mental Status: He is alert.     Neurovascular status was found to be moderately diminished with diminished PT pulses DP pulses and patient did have reduction of sharp dull vibratory.  He does have quite a bit of edema in the left foot and ankle and the moderate in the right foot and ankle and had negative Bevelyn Buckles' sign bilateral.  Patient has quite a bit of inflammation pain around the second MPJ of the right foot fluid buildup noted and does have history of gout with chronic kidney disease     Assessment:  Multiple issues with inflammatory capsulitis right probability gout related to kidney disease with chronic edema bilateral and has been ruled out as far as having DVT     Plan:  H&P reviewed all conditions.  Today were focusing on the right inflammatory condition I did do sterile prep of the right forefoot I injected the 7 MPJ with 2 mg dexamethasone Kenalog and I applied fascial anklet compression stockings bilateral to reduce the edema.  I discussed elevation I discussed reduced activities and we will see the response to this and may have to consider other treatments  X-rays indicate that there is mild osteoporosis no indication stress  fracture with moderate arthritis noted

## 2020-09-30 ENCOUNTER — Other Ambulatory Visit (HOSPITAL_BASED_OUTPATIENT_CLINIC_OR_DEPARTMENT_OTHER): Payer: Self-pay

## 2020-09-30 ENCOUNTER — Ambulatory Visit: Payer: 59 | Attending: Internal Medicine

## 2020-09-30 ENCOUNTER — Other Ambulatory Visit: Payer: Self-pay

## 2020-09-30 DIAGNOSIS — Z23 Encounter for immunization: Secondary | ICD-10-CM

## 2020-09-30 MED ORDER — PFIZER-BIONT COVID-19 VAC-TRIS 30 MCG/0.3ML IM SUSP
INTRAMUSCULAR | 0 refills | Status: AC
  Filled 2020-09-30: qty 0.3, 1d supply, fill #0

## 2020-09-30 NOTE — Progress Notes (Signed)
   Covid-19 Vaccination Clinic  Name:  Derrick Lawson    MRN: [id number] DOB: May 23, 1950  09/30/2020  Derrick Lawson was observed post Covid-19 immunization for 15 minutes without incident. He was provided with Vaccine Information Sheet and instruction to access the V-Safe system.   Derrick Lawson was instructed to call 911 with any severe reactions post vaccine: Difficulty breathing  Swelling of face and throat  A fast heartbeat  A bad rash all over body  Dizziness and weakness   Immunizations Administered     Name Date Dose VIS Date Route   PFIZER Comrnaty(Gray TOP) Covid-19 Vaccine 09/30/2020 10:29 AM 0.3 mL 02/06/2020 Intramuscular   Manufacturer: [employer]   Lot: [id number]   [place]: [phone]

## 2020-11-05 ENCOUNTER — Emergency Department (HOSPITAL_COMMUNITY)
Admission: EM | Admit: 2020-11-05 | Discharge: 2020-11-06 | Disposition: A | Discharge status: Home / Self Care | Payer: 59 | Attending: Emergency Medicine | Admitting: Emergency Medicine

## 2020-11-05 ENCOUNTER — Other Ambulatory Visit: Payer: Self-pay

## 2020-11-05 ENCOUNTER — Encounter (HOSPITAL_COMMUNITY): Payer: Self-pay

## 2020-11-05 DIAGNOSIS — M549 Dorsalgia, unspecified: Secondary | ICD-10-CM | POA: Diagnosis not present

## 2020-11-05 DIAGNOSIS — M79671 Pain in right foot: Secondary | ICD-10-CM | POA: Insufficient documentation

## 2020-11-05 DIAGNOSIS — Z5321 Procedure and treatment not carried out due to patient leaving prior to being seen by health care provider: Secondary | ICD-10-CM | POA: Diagnosis not present

## 2020-11-05 NOTE — ED Triage Notes (Addendum)
Right foot pain with swelling and redness near big toe x1 week and back pain x2 weeks. Went to PCP yesterday and received shot and thinks it was steroid shot with no relief

## 2020-11-06 ENCOUNTER — Ambulatory Visit
Admission: EM | Admit: 2020-11-06 | Discharge: 2020-11-06 | Disposition: A | Discharge status: Home / Self Care | Payer: 59 | Attending: Physician Assistant | Admitting: Physician Assistant

## 2020-11-06 ENCOUNTER — Encounter: Payer: Self-pay | Admitting: Emergency Medicine

## 2020-11-06 DIAGNOSIS — M109 Gout, unspecified: Secondary | ICD-10-CM | POA: Diagnosis not present

## 2020-11-06 DIAGNOSIS — M79674 Pain in right toe(s): Secondary | ICD-10-CM

## 2020-11-06 MED ORDER — PREDNISONE 10 MG PO TABS: ORAL_TABLET | ORAL | 0 refills | Status: DC

## 2020-11-06 NOTE — ED Notes (Signed)
Pt stated that "this is ridiculous. I have been here 9 hours and I have seen people who came after me go inside and come out and leave." This writer explained that some of those patients were minor care patients and that we also have had a few emergencies come in today. Pt still being verbally aggressive to this Probation officer

## 2020-11-06 NOTE — ED Provider Notes (Signed)
[facility]    CSN: [id number] Arrival date & time: 11/06/20  1403      History   Chief Complaint Chief Complaint  Patient presents with   Foot Injury    HPI Derrick Lawson is a 70 y.o. male.   Patient presents today with a 3-day history of right MTP pain.  He does have a history of gout but certainly gets this in his knee and elbow but states current symptoms are similar to previous episodes of this condition.  Pain is rated 7 on a 0-10 pain scale, localized to affected area with radiation into right foot, described as throbbing/aching, no alleviating factors identified.  He has been taking colchicine as previously prescribed this has not relieved any symptoms.  His last uric acid level obtained earlier this year was appropriate at 4.0.  He denies any recent medication changes, dietary changes, recent alcohol use.  He denies any known injury.  Denies any systemic symptoms including fever, headache, nausea, vomiting.  He has been using Tylenol without improvement of symptoms.  He is unable to take NSAIDs due to history of chronic kidney disease.   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 2018 or 2019 completed harvoni for 3 months w/ cure    Hyperlipidemia    Hypertension    followed by pcp   Idiopathic chronic gout of multiple sites with tophus    rheumologist-- dr m. patel   Lower urinary tract symptoms (LUTS)    Lumbar stenosis    Nocturia    OA (osteoarthritis)    Prostate cancer [facility]) urologist--- dr eskridge/  onologist--- dr Tammi Klippel   dx 08/ 2021,  Stage T1c,  Gleason 4+4    Patient Active Problem List   Diagnosis Date Noted   Malignant neoplasm of prostate ([place]) 11/14/2019   Idiopathic chronic gout of multiple sites with tophus 08/28/2019   Chronic pain disorder 02/07/2018   Lumbar degenerative disc disease 02/07/2018   Lumbar facet joint pain 02/07/2018   Spondylolisthesis of lumbar region 02/07/2018   Primary osteoarthritis of left knee 01/22/2018   Elevated PSA 11/02/2017   Mixed hyperlipidemia 10/27/2017   Portal hypertension ([place]) 10/24/2017   CKD (chronic kidney disease) stage 3, GFR 30-59 ml/min ([place]) 09/19/2017   Essential hypertension 08/21/2017   Hepatitis C virus infection cured after antiviral drug therapy 08/21/2017   Post-operative state 08/02/2013   Right inguinal hernia 06/14/2013    Past Surgical History:  Procedure Laterality Date   COLONOSCOPY WITH ESOPHAGOGASTRODUODENOSCOPY (EGD)  12-25-2017  dr Cindee Salt   CYSTOSCOPY  02/14/2020   Procedure: Erlene Quan;  Surgeon: Festus Aloe, MD;  Location: [facility];  Service: Urology;;  ONE SEED FOUND IN BLADDER AND REMOVED   INGUINAL HERNIA REPAIR Right 07/11/2013   Procedure: RIGHT HERNIA REPAIR INGUINAL ADULT;  Surgeon: Joyice Faster. Cornett, MD;  Location: [place];  Service: General;  Laterality: Right;   INSERTION OF MESH Right 07/11/2013   Procedure: INSERTION OF MESH;  Surgeon: Joyice Faster. Cornett, MD;  Location: [place];  Service: General;  Laterality: Right;   RADIOACTIVE SEED IMPLANT N/A 02/14/2020   Procedure: RADIOACTIVE SEED IMPLANT/BRACHYTHERAPY IMPLANT;  Surgeon: Festus Aloe, MD;  Location: [place];  Service: Urology;  Laterality: N/A;    73 SEEDS IMPLANTED   SPACE OAR INSTILLATION N/A 02/14/2020   Procedure: SPACE OAR INSTILLATION;  Surgeon: Festus Aloe, MD;  Location: [facility];  Service: Urology;  Laterality: N/A;       Home Medications    Prior to Admission medications   Medication Sig Start Date End Date Taking? Authorizing Provider  predniSONE (DELTASONE) 10 MG tablet Take 2 tablets for 5 days then decrease to 1 tablet for 5 days then stop. 11/06/20  Yes Joye Wesenberg K, PA-C  amLODipine (NORVASC) 10 MG tablet Take 10 mg by mouth daily. 10/27/17   [provider]  atorvastatin (LIPITOR) 40 MG tablet Take 40 mg by mouth daily. 10/13/19   [provider]  colchicine 0.6 MG tablet Take 0.6 mg by mouth every other day. Patient not taking: Reported on 05/13/2020    [provider]  COVID-19 mRNA Vac-TriS, Pfizer, (PFIZER-BIONT COVID-19 VAC-TRIS) SUSP injection Inject into the muscle. 09/30/20   Carlyle Basques, MD  diclofenac Sodium (VOLTAREN) 1 % GEL Apply 2 g topically 4 (four) times daily. Patient not taking: Reported on 05/13/2020 12/28/19   Couture, Cortni S, PA-C  doxycycline (VIBRAMYCIN) 100 MG capsule Take 1 capsule (100 mg total) by mouth 2 (two) times daily. 05/18/20   Rancour, Annie Main, MD  febuxostat (ULORIC) 40 MG tablet Take 40 mg by mouth daily. 08/26/19   [provider]  glipiZIDE (GLUCOTROL) 5 MG tablet Take by mouth. 04/29/20   [provider]  oxyCODONE-acetaminophen (PERCOCET) 7.5-325 MG tablet Take 1 tablet by mouth every 6 (six) hours as needed for severe pain. 02/14/20   Festus Aloe, MD  tamsulosin (FLOMAX) 0.4 MG CAPS capsule Take by mouth. 04/29/20   [provider]    Family History Family History  Problem Relation Age of Onset   Breast cancer Sister    Prostate cancer Neg Hx    Colon cancer Neg Hx    Pancreatic cancer Neg Hx      Social History Social History   Tobacco Use   Smoking status: Never   Smokeless tobacco: Never  Vaping Use   Vaping Use: Never used  Substance Use Topics   Alcohol use: No   Drug use: No    Comment: hx iv drugs as youth     Allergies   Patient has no known allergies.   Review of Systems Review of Systems  Constitutional:  Positive for activity change. Negative for appetite change, fatigue and fever.  Respiratory:  Negative for cough and shortness of breath.   Cardiovascular:  Negative for chest pain.  Gastrointestinal:  Negative for abdominal pain, diarrhea, nausea and vomiting.  Musculoskeletal:  Positive for arthralgias and joint swelling. Negative for myalgias.  Neurological:  Negative for dizziness, weakness, light-headedness, numbness and headaches.    Physical Exam Triage Vital Signs ED Triage Vitals  Enc Vitals Group  BP 11/06/20 1557 124/85     Pulse Rate 11/06/20 1557 92     Resp --      Temp 11/06/20 1557 99.3 F (37.4 C)     Temp Source 11/06/20 1557 Oral     SpO2 11/06/20 1557 97 %     Weight --      Height --      Head Circumference --      Peak Flow --      Pain Score 11/06/20 1506 7     Pain Loc --      Pain Edu? --      Excl. in [place]? --    No data found.  Updated Vital Signs BP 124/85 (BP Location: Left Arm)   Pulse 92   Temp 99.3 F (37.4 C) (Oral)   SpO2 97%   Visual Acuity Right Eye Distance:   Left Eye Distance:   Bilateral Distance:    Right Eye Near:   Left Eye Near:    Bilateral Near:     Physical Exam Vitals reviewed.  Constitutional:      General: He is awake.     Appearance: Normal appearance. He is normal weight. He is not ill-appearing.     Comments: Very pleasant male appears stated age in no acute distress  HENT:     Head: Normocephalic and atraumatic.  Cardiovascular:     Rate and Rhythm: Normal rate and regular rhythm.     Heart sounds: Normal heart sounds, S1 normal and S2 normal. No murmur heard.     Comments: Right leg/foot: 2+ pitting edema at right foot.  Negative Homans' sign and no significant swelling noted in calf. Pulmonary:     Effort: Pulmonary effort is normal.     Breath sounds: Normal breath sounds. No stridor. No wheezing, rhonchi or rales.     Comments: Clear to auscultation bilaterally Musculoskeletal:     Right lower leg: Edema present.     Left lower leg: No edema.     Right foot: Decreased range of motion. No deformity.  Feet:     Right foot:     Protective Sensation: 10 sites tested.  10 sites sensed.     Skin integrity: Erythema and warmth present.     Toenail Condition: Right toenails are abnormally thick.     Comments: Right foot: Erythema and tenderness palpation at right MTP.  Decreased range of motion secondary to pain.  Foot neurovascularly intact. Neurological:     Mental Status: He is alert.  Psychiatric:        Behavior: Behavior is cooperative.     UC Treatments / Results  Labs (all labs ordered are listed, but only abnormal results are displayed) Labs Reviewed - No data to display  EKG   Radiology No results found.  Procedures Procedures (including critical care time)  Medications Ordered in UC Medications - No data to display  Initial Impression / Assessment and Plan / UC Course  I have reviewed the triage vital signs and the nursing notes.  Pertinent labs & imaging results that were available during my care of the patient were reviewed by me and considered in my medical decision making (see chart for details).      Symptoms consistent with gout.  Patient has a history of chronic kidney disease so is unable to take NSAIDs and has already been taking colchicine which has been ineffective.  We will start prednisone taper of 20 mg for 5 days then  10 mg for 5 days.  Patient does report a history of diabetes and will monitor his blood sugar closely while on this medication.  He was instructed to avoid carbohydrates and drink plenty of  fluid.  If his blood sugars consistently above 200 he needs to be reevaluated for medication adjustment.  Encouraged him to use Tylenol for breakthrough pain.  He is to keep foot elevated.  Discussed that if symptoms or not improving or if anything worsens including if he develops any systemic symptoms such as spread of redness, increased pain, fever, nausea, vomiting, body aches he needs to be seen immediately.  Recommended that he follow-up with his primary care provider or podiatrist within a week to ensure symptom improvement.  Strict return precautions given to which she expressed understanding.  Final Clinical Impressions(s) / UC Diagnoses   Final diagnoses:  Acute gout involving toe of right foot, unspecified cause  Great toe pain, right     Discharge Instructions      I believe that you have gout.  Please start prednisone as directed.  Do not take NSAIDs including aspirin, ibuprofen/Advil, naproxen/Aleve with this medication.  This can raise your blood sugar so monitor that closely and if it becomes above 200 regularly please be evaluated.  You can use Tylenol for breakthrough pain.  If you have any worsening symptoms including persistent pain, fever, spread of redness, nausea, vomiting you need to be seen immediately.     ED Prescriptions     Medication Sig Dispense Auth. Provider   predniSONE (DELTASONE) 10 MG tablet Take 2 tablets for 5 days then decrease to 1 tablet for 5 days then stop. 15 tablet Ioan Landini, Derry Skill, PA-C      PDMP not reviewed this encounter.   Terrilee Croak, PA-C 11/06/20 [id number]

## 2020-11-06 NOTE — ED Notes (Signed)
Called 3x for room placement. Eloped.

## 2020-11-06 NOTE — ED Triage Notes (Signed)
Right foot pain x 3 days, denies injury, hx of gout.

## 2020-11-06 NOTE — Discharge Instructions (Addendum)
I believe that you have gout.  Please start prednisone as directed.  Do not take NSAIDs including aspirin, ibuprofen/Advil, naproxen/Aleve with this medication.  This can raise your blood sugar so monitor that closely and if it becomes above 200 regularly please be evaluated.  You can use Tylenol for breakthrough pain.  If you have any worsening symptoms including persistent pain, fever, spread of redness, nausea, vomiting you need to be seen immediately.

## 2021-01-23 ENCOUNTER — Other Ambulatory Visit: Payer: Self-pay

## 2021-01-23 ENCOUNTER — Encounter: Payer: Self-pay | Admitting: *Deleted

## 2021-01-23 ENCOUNTER — Ambulatory Visit
Admission: EM | Admit: 2021-01-23 | Discharge: 2021-01-23 | Disposition: A | Discharge status: Home / Self Care | Payer: 59 | Attending: Physician Assistant | Admitting: Physician Assistant

## 2021-01-23 DIAGNOSIS — M109 Gout, unspecified: Secondary | ICD-10-CM

## 2021-01-23 HISTORY — DX: Gout, unspecified: M10.9

## 2021-01-23 MED ORDER — PREDNISONE 20 MG PO TABS: 40.0000 mg | ORAL_TABLET | Freq: Every day | ORAL | 0 refills | Status: AC

## 2021-01-23 NOTE — ED Triage Notes (Signed)
Denies injury.  C/O right elbow pain onset 1 wk ago.  C/O painful ROM and tenderness to touch. Has been taking Tyl and applying Voltaren gel.  RUE CMS intact.

## 2021-01-23 NOTE — ED Provider Notes (Signed)
[facility]    CSN: [id number] Arrival date & time: 01/23/21  1334      History   Chief Complaint Chief Complaint  Patient presents with   Arm Pain    HPI Derrick Lawson is a 70 y.o. male.   Patient here today for evaluation of pain and swelling in his right elbow that been present for the last week.  He reports he has had similar with gout in the past.  It was noted that he had tophaceous gout to his elbow.  He has not had any fever.  He does not report any numbness or tingling.  Pain is worsened with movement as well as palpation.  The history is provided by the patient.  Arm Pain Pertinent negatives include no shortness of breath.   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 2018 or 2019 completed harvoni for 3 months w/ cure   Hyperlipidemia    Hypertension    followed by pcp   Idiopathic chronic gout of multiple sites with tophus    rheumologist-- dr m. patel   Lower urinary tract symptoms (LUTS)    Lumbar stenosis    Nocturia    OA (osteoarthritis)    Prostate cancer [facility]) urologist--- dr eskridge/  onologist--- dr Tammi Klippel   dx 08/ 2021,  Stage T1c,  Gleason 4+4    Patient Active Problem List   Diagnosis  Date Noted   Malignant neoplasm of prostate ([place]) 11/14/2019   Idiopathic chronic gout of multiple sites with tophus 08/28/2019   Chronic pain disorder 02/07/2018   Lumbar degenerative disc disease 02/07/2018   Lumbar facet joint pain 02/07/2018   Spondylolisthesis of lumbar region 02/07/2018   Primary osteoarthritis of left knee 01/22/2018   Elevated PSA 11/02/2017   Mixed hyperlipidemia 10/27/2017   Portal hypertension ([place]) 10/24/2017   CKD (chronic kidney disease) stage 3, GFR 30-59 ml/min ([place]) 09/19/2017   Essential hypertension 08/21/2017   Hepatitis C virus infection 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 Cornett, MD;  Location: [place];  Service: General;  Laterality: Right;   INSERTION OF MESH Right 07/11/2013  Procedure: INSERTION OF MESH;  Surgeon: Joyice Faster. Cornett, MD;  Location: [place];  Service: General;  Laterality: Right;   RADIOACTIVE SEED IMPLANT N/A 02/14/2020   Procedure: RADIOACTIVE SEED IMPLANT/BRACHYTHERAPY IMPLANT;  Surgeon: Festus Aloe, MD;  Location: [place];  Service: Urology;  Laterality: N/A;    50 SEEDS IMPLANTED   SPACE OAR INSTILLATION N/A 02/14/2020   Procedure: SPACE OAR INSTILLATION;  Surgeon: Festus Aloe, MD;  Location: [facility];  Service: Urology;  Laterality: N/A;       Home Medications    Prior to Admission medications   Medication Sig  Start Date End Date Taking? Authorizing Provider  amLODipine (NORVASC) 10 MG tablet Take 10 mg by mouth daily. 10/27/17  Yes [provider]  atorvastatin (LIPITOR) 40 MG tablet Take 40 mg by mouth daily. 10/13/19  Yes [provider]  diclofenac Sodium (VOLTAREN) 1 % GEL Apply 2 g topically 4 (four) times daily. 12/28/19  Yes Couture, Cortni S, PA-C  febuxostat (ULORIC) 40 MG tablet Take 40 mg by mouth daily. 08/26/19  Yes [provider]  glipiZIDE (GLUCOTROL) 5 MG tablet Take by mouth. 04/29/20  Yes [provider]  predniSONE (DELTASONE) 20 MG tablet Take 2 tablets (40 mg total) by mouth daily with breakfast for 5 days. 01/23/21 01/28/21 Yes Francene Finders, PA-C  tamsulosin (FLOMAX) 0.4 MG CAPS capsule Take by mouth. 04/29/20  Yes [provider]  colchicine 0.6 MG tablet Take 0.6 mg by mouth every other day. Patient not taking: Reported on 05/13/2020    [provider]  COVID-19 mRNA Vac-TriS, Pfizer, (PFIZER-BIONT COVID-19 VAC-TRIS) SUSP injection Inject into the muscle. 09/30/20   Carlyle Basques, MD  doxycycline (VIBRAMYCIN) 100 MG capsule Take 1 capsule (100 mg total) by mouth 2 (two) times daily. 05/18/20   Rancour, Annie Main, MD  oxyCODONE-acetaminophen (PERCOCET) 7.5-325 MG tablet Take 1 tablet by mouth every 6 (six) hours as needed for severe pain. 02/14/20   Festus Aloe, MD    Family History Family History  Problem Relation Age of Onset   Breast cancer Sister    Prostate cancer Neg Hx    Colon cancer Neg Hx    Pancreatic cancer Neg Hx     Social History Social History   Tobacco Use   Smoking status: Never   Smokeless tobacco: Never  Vaping Use   Vaping Use: Never used  Substance Use Topics   Alcohol use: No   Drug use: No    Comment: hx iv drugs as youth     Allergies   Patient has no known allergies.   Review of Systems Review of Systems  Constitutional:  Negative for chills and fever.  Eyes:  Negative for  discharge and redness.  Respiratory:  Negative for shortness of breath.   Musculoskeletal:  Positive for arthralgias and joint swelling.  Skin:  Positive for color change and wound.  Neurological:  Negative for numbness.    Physical Exam Triage Vital Signs ED Triage Vitals  Enc Vitals Group     BP 01/23/21 1435 127/88     Pulse Rate 01/23/21 1435 80     Resp 01/23/21 1435 18     Temp 01/23/21 1435 97.6 F (36.4 C)     Temp Source 01/23/21 1435 Temporal     SpO2 01/23/21 1435 96 %     Weight --      Height --      Head Circumference --  Peak Flow --      Pain Score 01/23/21 1436 6     Pain Loc --      Pain Edu? --      Excl. in [place]? --    No data found.  Updated Vital Signs BP 127/88   Pulse 80   Temp 97.6 F (36.4 C) (Temporal)   Resp 18   SpO2 96%      Physical Exam Vitals and nursing note reviewed.  Constitutional:      General: He is not in acute distress.    Appearance: Normal appearance. He is not ill-appearing.  HENT:     Head: Normocephalic and atraumatic.  Eyes:     Conjunctiva/sclera: Conjunctivae normal.  Cardiovascular:     Rate and Rhythm: Normal rate.  Pulmonary:     Effort: Pulmonary effort is normal.  Musculoskeletal:     Comments: Diffuse swelling of right olecranon process. TTP to same. Decreased ROM of right elbow due to swelling and pain.  Neurological:     Mental Status: He is alert.  Psychiatric:        Mood and Affect: Mood normal.        Behavior: Behavior normal.        Thought Content: Thought content normal.     UC Treatments / Results  Labs (all labs ordered are listed, but only abnormal results are displayed) Labs Reviewed - No data to display  EKG   Radiology No results found.  Procedures Procedures (including critical care time)  Medications Ordered in UC Medications - No data to display  Initial Impression / Assessment and Plan / UC Course  I have reviewed the triage vital signs and the nursing  notes.  Pertinent labs & imaging results that were available during my care of the patient were reviewed by me and considered in my medical decision making (see chart for details).  Prednisone prescribed for suspected gout flare but would also be helpful if cause of swelling was bursitis.  On palpation area was firm so less likely bursitis as no fluid was appreciated.  Encouraged follow-up if no improvement with treatment or if symptoms worsen in any way.  Final Clinical Impressions(s) / UC Diagnoses   Final diagnoses:  Acute gout of right elbow, unspecified cause   Discharge Instructions   None    ED Prescriptions     Medication Sig Dispense Auth. Provider   predniSONE (DELTASONE) 20 MG tablet Take 2 tablets (40 mg total) by mouth daily with breakfast for 5 days. 10 tablet Francene Finders, PA-C      PDMP not reviewed this encounter.   Francene Finders, PA-C 01/23/21 1524

## 2021-02-26 ENCOUNTER — Encounter: Payer: Self-pay | Admitting: Medical Oncology

## 2021-07-23 IMAGING — DX DG ANKLE COMPLETE 3+V*L*
3 series · 3 of 3 positions shown · non-contrast
Comparison: None.

CLINICAL DATA: Redness, swelling

EXAM:
LEFT ANKLE COMPLETE - 3+ VIEW

[ankle ap]
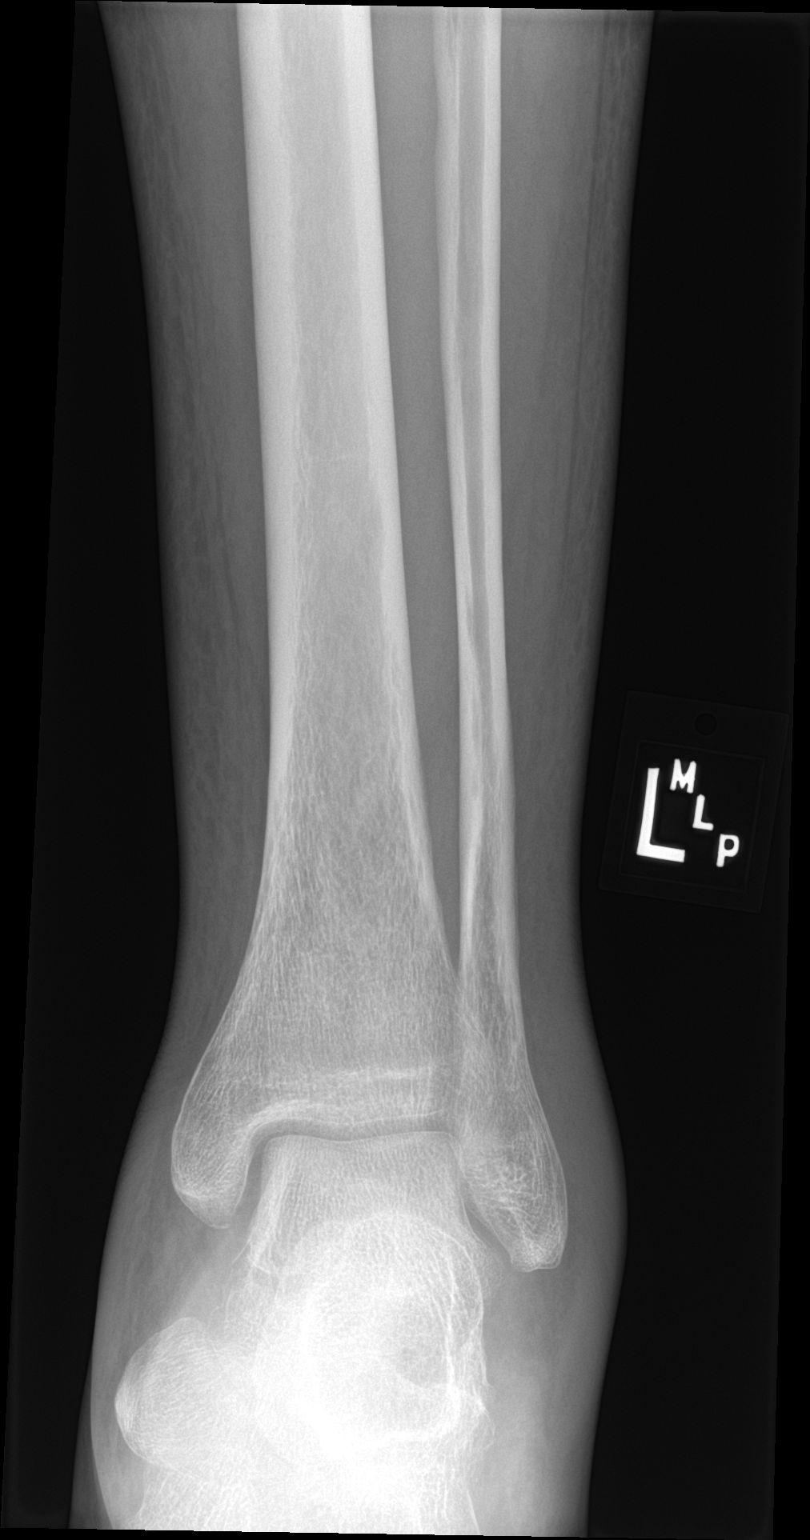

[ankle obl]
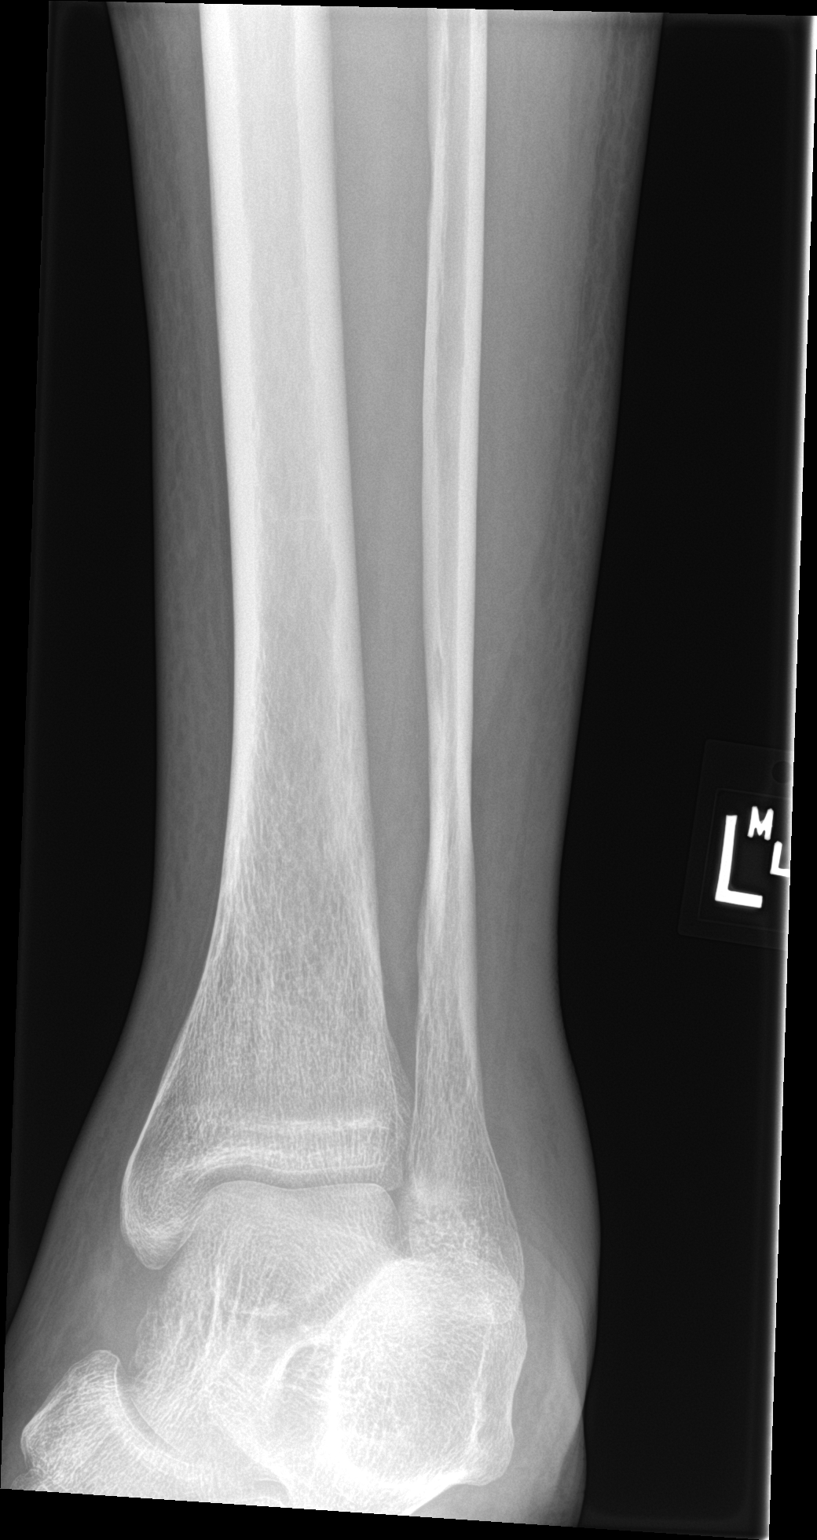

[ankle lat]
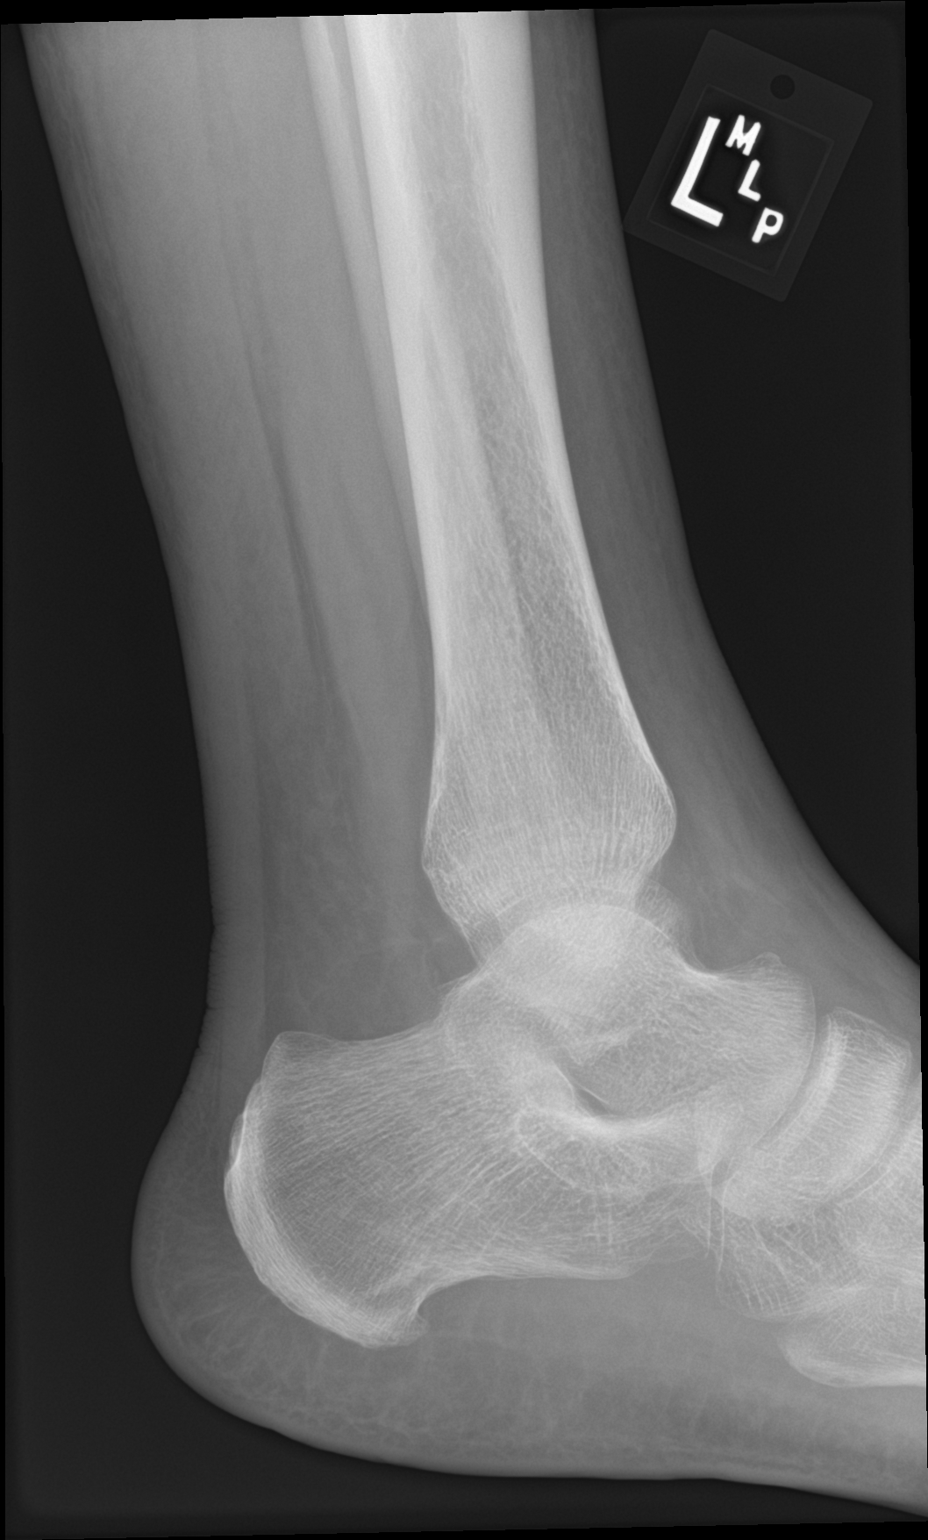

[3 of 3 positions shown; findings below may reference images not displayed]

FINDINGS: Lateral soft tissue swelling at the ankle. No acute bony
abnormality. Specifically, no fracture, subluxation, or dislocation.
IMPRESSION: No acute bony abnormality.

## 2022-03-16 ENCOUNTER — Encounter (HOSPITAL_COMMUNITY): Payer: Self-pay

## 2022-03-16 ENCOUNTER — Other Ambulatory Visit: Payer: Self-pay

## 2022-03-16 ENCOUNTER — Emergency Department (HOSPITAL_COMMUNITY): Payer: 59

## 2022-03-16 ENCOUNTER — Emergency Department (HOSPITAL_COMMUNITY)
Admission: EM | Admit: 2022-03-16 | Discharge: 2022-03-17 | Discharge status: Against Medical Advice | Payer: 59 | Attending: Student | Admitting: Student

## 2022-03-16 DIAGNOSIS — M545 Low back pain, unspecified: Secondary | ICD-10-CM | POA: Diagnosis not present

## 2022-03-16 DIAGNOSIS — Z5321 Procedure and treatment not carried out due to patient leaving prior to being seen by health care provider: Secondary | ICD-10-CM | POA: Insufficient documentation

## 2022-03-16 LAB — URINALYSIS, ROUTINE W REFLEX MICROSCOPIC
Bacteria, UA: NONE SEEN
Bilirubin Urine: NEGATIVE
Glucose, UA: >=500 mg/dL — AB
Hgb urine dipstick: NEGATIVE
Ketones, ur: 5 mg/dL — AB
Leukocytes,Ua: NEGATIVE
Nitrite: NEGATIVE
Protein, ur: NEGATIVE mg/dL
Specific Gravity, Urine: 1.014 (ref 1.005–1.030)
pH: 5 (ref 5.0–8.0)

## 2022-03-16 NOTE — ED Provider Triage Note (Signed)
Emergency Medicine Provider Triage Evaluation Note  Derrick Lawson , a 72 y.o. male  was evaluated in triage.  Pt complains of right-sided low back pain.  Patient reports his pain has been present for several days now and he became concerned as he felt this pain was radiating down into his lower leg.  Patient denies any significant loss of sensation or weakness in the leg, but does report the leg feels weaker than left leg due to pain.  Has tried taking naproxen without significant improvement in pain.  Denies dysuria, hematuria, increased urinary frequency.  Review of Systems  Positive: As above Negative: As above  Physical Exam  BP (!) 149/102   Pulse 75   Temp 98.3 F (36.8 C) (Oral)   Resp 16   SpO2 98%  Gen:   Awake, no distress   Resp:  Normal effort  MSK:   Moves extremities without difficulty, tenderness to palpation in right low back Other:    Medical Decision Making  Medically screening exam initiated at 9:33 PM.  Appropriate orders placed.  ILAN KAHRS was informed that the remainder of the evaluation will be completed by another provider, this initial triage assessment does not replace that evaluation, and the importance of remaining in the ED until their evaluation is complete.     Luvenia Heller, PA-C 03/16/22 2134

## 2022-03-16 NOTE — ED Triage Notes (Signed)
Reports back pain worse with movement.   Began as anterior left lower leg pain 2 days ago that has gotten worse.

## 2022-03-17 ENCOUNTER — Ambulatory Visit
Admission: EM | Admit: 2022-03-17 | Discharge: 2022-03-17 | Disposition: A | Discharge status: Home / Self Care | Payer: 59 | Attending: Physician Assistant | Admitting: Physician Assistant

## 2022-03-17 DIAGNOSIS — M5441 Lumbago with sciatica, right side: Secondary | ICD-10-CM

## 2022-03-17 DIAGNOSIS — M25551 Pain in right hip: Secondary | ICD-10-CM

## 2022-03-17 DIAGNOSIS — M79604 Pain in right leg: Secondary | ICD-10-CM | POA: Diagnosis not present

## 2022-03-17 MED ORDER — PREDNISONE 10 MG PO TABS: 10.0000 mg | ORAL_TABLET | Freq: Every day | ORAL | 0 refills | Status: AC

## 2022-03-17 MED ORDER — IBUPROFEN 600 MG PO TABS: 600.0000 mg | ORAL_TABLET | Freq: Four times a day (QID) | ORAL | 0 refills | Status: AC | PRN

## 2022-03-17 NOTE — ED Provider Notes (Signed)
[facility]    CSN: [id number] Arrival date & time: 03/17/22  1238      History   Chief Complaint Chief Complaint  Patient presents with   Leg Pain    HPI Derrick Lawson is a 72 y.o. male.   72 year old male presents with right leg and lower back pain.  Patient indicates for the past 3 days he has been having right leg pain and discomfort where his leg feels "unusual".  He relates that the discomfort and pain is worse when he stands and turns.  He indicates he is not having any numbness, tingling, or weakness.  He indicates he has been having lower back pain mainly on the right side for the past several days.  He indicates he has not injured his back, right hip, right leg.  He indicates he has not fallen.  He relates he has been taking some Aleve/Naprosyn OTC which has not given him relief from the discomfort.  He indicates he has been having lower back pain which has been a intermittent over the past several weeks.   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 2018 or 2019 completed harvoni for 3 months w/ cure   Hyperlipidemia     Hypertension    followed by pcp   Idiopathic chronic gout of multiple sites with tophus    rheumologist-- dr m. patel   Lower urinary tract symptoms (LUTS)    Lumbar stenosis    Nocturia    OA (osteoarthritis)    Prostate cancer [facility]) urologist--- dr eskridge/  onologist--- dr Tammi Klippel   dx 08/ 2021,  Stage T1c,  Gleason 4+4    Patient Active Problem List   Diagnosis Date Noted   Malignant neoplasm of prostate ([place]) 11/14/2019   Idiopathic chronic gout of multiple sites with tophus 08/28/2019   Chronic pain disorder 02/07/2018   Lumbar degenerative disc disease 02/07/2018   Lumbar facet joint pain 02/07/2018   Spondylolisthesis of lumbar region 02/07/2018   Primary osteoarthritis of left knee 01/22/2018   Elevated PSA 11/02/2017   Mixed hyperlipidemia 10/27/2017   Portal hypertension ([place]) 10/24/2017   CKD (chronic kidney disease) stage 3, GFR 30-59 ml/min ([place]) 09/19/2017   Essential hypertension 08/21/2017   Hepatitis C virus infection cured after antiviral drug therapy 08/21/2017   Post-operative state 08/02/2013   Right inguinal hernia 06/14/2013    Past Surgical History:  Procedure Laterality Date   COLONOSCOPY WITH ESOPHAGOGASTRODUODENOSCOPY (EGD)  12-25-2017  dr Cindee Salt   CYSTOSCOPY  02/14/2020   Procedure: CYSTOSCOPY FLEXIBLE;  Surgeon: Festus Aloe, MD;  Location: [facility];  Service: Urology;;   ONE SEED FOUND IN BLADDER AND REMOVED   HERNIA REPAIR     INGUINAL HERNIA REPAIR Right 07/11/2013   Procedure: RIGHT HERNIA REPAIR INGUINAL ADULT;  Surgeon: Joyice Faster. Cornett, MD;  Location: [place];  Service: General;  Laterality: Right;   INSERTION OF MESH Right 07/11/2013   Procedure: INSERTION OF MESH;  Surgeon: Joyice Faster. Cornett, MD;  Location: [place];  Service: General;  Laterality: Right;   RADIOACTIVE SEED IMPLANT N/A 02/14/2020   Procedure: RADIOACTIVE SEED IMPLANT/BRACHYTHERAPY IMPLANT;  Surgeon: Festus Aloe, MD;  Location: [place];  Service: Urology;  Laterality: N/A;    28 SEEDS IMPLANTED   SPACE OAR INSTILLATION N/A 02/14/2020   Procedure: SPACE OAR INSTILLATION;  Surgeon: Festus Aloe, MD;  Location: [facility];  Service: Urology;  Laterality: N/A;       Home Medications    Prior to Admission medications   Medication Sig Start Date End Date Taking? Authorizing Provider  ibuprofen (ADVIL) 600 MG tablet Take 1 tablet (600 mg total) by mouth every 6 (six) hours as needed. 03/17/22  Yes Nyoka Lint, PA-C  predniSONE (DELTASONE) 10 MG tablet Take 1 tablet (10 mg total) by mouth daily. 03/17/22  Yes Nyoka Lint, PA-C  amLODipine (NORVASC) 10 MG tablet Take 10 mg by mouth daily. 10/27/17   [provider]  atorvastatin (LIPITOR) 40 MG tablet Take 40 mg by mouth daily. 10/13/19   [provider]  colchicine 0.6 MG tablet Take 0.6 mg by mouth every other day. Patient not taking: Reported on 05/13/2020    [provider]  COVID-19 mRNA Vac-TriS, Pfizer, (PFIZER-BIONT COVID-19 VAC-TRIS) SUSP injection Inject into the muscle. 09/30/20   Carlyle Basques, MD  diclofenac Sodium (VOLTAREN) 1 % GEL Apply 2 g topically 4 (four) times daily. 12/28/19   Couture, Cortni S, PA-C  doxycycline (VIBRAMYCIN) 100 MG capsule Take 1 capsule (100 mg total) by mouth 2 (two) times daily. 05/18/20   Rancour, Annie Main, MD  febuxostat (ULORIC) 40 MG tablet Take 40 mg by mouth daily. 08/26/19   [provider]  glipiZIDE (GLUCOTROL) 5 MG tablet Take by mouth. 04/29/20   [provider]  oxyCODONE-acetaminophen (PERCOCET) 7.5-325 MG tablet Take 1 tablet by mouth every 6 (six) hours as needed for severe pain. 02/14/20   Festus Aloe, MD  tamsulosin (FLOMAX) 0.4 MG CAPS capsule Take by mouth. 04/29/20   [provider]    Family History Family History  Problem Relation Age of Onset   Breast cancer Sister    Prostate cancer Neg Hx     Colon cancer Neg Hx    Pancreatic cancer Neg Hx     Social History Social History   Tobacco Use   Smoking status: Never   Smokeless tobacco: Never  Vaping Use   Vaping Use: Never used  Substance Use Topics   Alcohol use: No   Drug use: No    Comment: hx iv drugs as youth     Allergies   Patient has no known allergies.   Review of Systems Review of Systems  Musculoskeletal:  Positive for back pain (lower back right side and right leg pain).     Physical Exam Triage Vital Signs ED Triage Vitals  Enc Vitals Group     BP 03/17/22 1307 (!) 145/101     Pulse Rate 03/17/22 1307 79  Resp 03/17/22 1307 16     Temp 03/17/22 1307 97.9 F (36.6 C)     Temp Source 03/17/22 1307 Oral     SpO2 03/17/22 1307 96 %     Weight --      Height --      Head Circumference --      Peak Flow --      Pain Score 03/17/22 1309 7     Pain Loc --      Pain Edu? --      Excl. in [place]? --    No data found.  Updated Vital Signs BP (!) 145/101 (BP Location: Right Arm)   Pulse 79   Temp 97.9 F (36.6 C) (Oral)   Resp 16   SpO2 96%   Visual Acuity Right Eye Distance:   Left Eye Distance:   Bilateral Distance:    Right Eye Near:   Left Eye Near:    Bilateral Near:     Physical Exam Constitutional:      Appearance: Normal appearance.  Abdominal:     General: Abdomen is flat. Bowel sounds are normal.     Palpations: Abdomen is soft.     Tenderness: There is no abdominal tenderness. There is no guarding or rebound.  Musculoskeletal:       Back:     Comments: Back: Pain is palpated along the right L3-L5 paraspinous area without any unusual redness or swelling of the skin. Range of motion is normal, twisting reproduces the back pain. Negative straight leg raise bilaterally. Right hip: Mild pain is palpated along the posterior hip area, worse with internal/external rotation. Stability is intact without crepitus on rotation.  Range of motion is normal.  Neurological:      Mental Status: He is alert.      UC Treatments / Results  Labs (all labs ordered are listed, but only abnormal results are displayed) Labs Reviewed - No data to display  EKG   Radiology DG Lumbar Spine Complete  Result Date: 03/16/2022 CLINICAL DATA:  Low back pain EXAM: LUMBAR SPINE - COMPLETE 4+ VIEW COMPARISON:  X-ray lumbar spine 01/18/2018 FINDINGS: Five non-rib-bearing lumbar vertebral bodies. There is no evidence of lumbar spine fracture. Mild dextroscoliosis of the lumbar spine centered at the L4 level. Stable grade 1 anterolisthesis of L4 on L5. Multilevel similar-appearing moderate to severe degenerative changes of the lower lumbar spine. Similar-appearing slight concavity of the L3 superior endplate with associated Schmorl node. L4-L5 and L5-S1 intervertebral disc space narrowing. IMPRESSION: No acute displaced fracture or traumatic listhesis of the lumbar spine. Electronically Signed   By: Iven Finn M.D.   On: 03/16/2022 22:08    Procedures Procedures (including critical care time)  Medications Ordered in UC Medications - No data to display  Initial Impression / Assessment and Plan / UC Course  I have reviewed the triage vital signs and the nursing notes.  Pertinent labs & imaging results that were available during my care of the patient were reviewed by me and considered in my medical decision making (see chart for details).    Plan: 1.  The right-sided low back pain will be treated with the following: A.  Ibuprofen 600 mg every 6 hours with food to help reduce pain and discomfort. B.  Prednisone 10 mg 3 times a day for 5 days only to help reduce acute nerve inflammation. 2.  The right hip pain will be treated with the following: A.  Ibuprofen 600 mg every 6  hours with food to help reduce pain and discomfort. 3.  The right leg pain will be treated with the following: A.  Ibuprofen 600 mg every 6 hours with food to help reduce pain and discomfort. 4.  Advised  follow-up PCP or return to urgent care as needed. Final Clinical Impressions(s) / UC Diagnoses   Final diagnoses:  Right hip pain  Right leg pain  Acute right-sided low back pain with right-sided sciatica     Discharge Instructions      Advised take ibuprofen 600 mg every 6 hours with food on a regular basis to decrease pain and discomfort. Advised take prednisone 10 mg 3 times a day for 5 days only to help reduce nerve inflammation.  Advised to follow-up with PCP or return to urgent care if symptoms fail to improve.    ED Prescriptions     Medication Sig Dispense Auth. Provider   ibuprofen (ADVIL) 600 MG tablet Take 1 tablet (600 mg total) by mouth every 6 (six) hours as needed. 30 tablet Nyoka Lint, PA-C   predniSONE (DELTASONE) 10 MG tablet Take 1 tablet (10 mg total) by mouth daily. 15 tablet Nyoka Lint, PA-C      PDMP not reviewed this encounter.   Nyoka Lint, PA-C 03/17/22 1328

## 2022-03-17 NOTE — Discharge Instructions (Signed)
Advised take ibuprofen 600 mg every 6 hours with food on a regular basis to decrease pain and discomfort. Advised take prednisone 10 mg 3 times a day for 5 days only to help reduce nerve inflammation.  Advised to follow-up with PCP or return to urgent care if symptoms fail to improve.

## 2022-03-17 NOTE — ED Notes (Signed)
Pt unable to be located for room

## 2022-03-17 NOTE — ED Triage Notes (Signed)
Patient presents to UC for right leg pain and right hip pain x 3 days. Pt states pain is worse, having difficulties walking. Treating pain with naproxen. Last dose 1000.

## 2022-03-17 NOTE — ED Provider Notes (Signed)
Patient eloped from the emergency department prior to my examination   Derrick Lower, MD 03/17/22 1127

## 2022-10-14 ENCOUNTER — Ambulatory Visit
Admission: EM | Admit: 2022-10-14 | Discharge: 2022-10-14 | Disposition: A | Discharge status: Home / Self Care | Payer: 59 | Source: Home / Self Care

## 2022-10-14 DIAGNOSIS — H1132 Conjunctival hemorrhage, left eye: Secondary | ICD-10-CM | POA: Diagnosis not present

## 2022-10-14 NOTE — Discharge Instructions (Signed)
You have a subconjunctival hemorrhage of your left eye.  This should resolve on its own in the next couple of weeks.  If you start developing any vision changes to the eye, swelling, drainage, or any other new symptoms, please return. Otherwise schedule an appointment with your eye doctor for follow-up in the next 1 to 2 weeks.

## 2022-10-14 NOTE — ED Triage Notes (Signed)
"  I woke up yesterday morning and left eye was red, I believe now it worse since upon waking up yesterday". No visual disturbance. No pain. No burning. No itching. No discharge.

## 2022-10-14 NOTE — ED Provider Notes (Signed)
[facility]    CSN: [id number] Arrival date & time: 10/14/22  1743      History   Chief Complaint Chief Complaint  Patient presents with   Eye Problem    HPI Derrick Lawson is a 72 y.o. male.   Patient presents to urgent care for evaluation of left eye redness that he first noticed yesterday morning when he woke up.  No recent trauma, injuries, drainage, swelling, or foreign bodies to the left eye.  Denies vision changes, photophobia, and rash.  No itching or pain to the left eye.  He states "I would not know there was a problem with my eye if I did not look in the mirror".  Wears reading glasses intermittently for vision correction, otherwise does not use glasses.  Attempted use of Clear Eyes eyedrops and Visine eyedrops without relief of redness to the eye.  No recent sick contacts with similar symptoms.     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 2018 or 2019 completed harvoni for 3 months w/ cure   Hyperlipidemia    Hypertension    followed by pcp   Idiopathic chronic gout of multiple sites with tophus    rheumologist-- dr m. patel   Lower urinary tract symptoms (LUTS)    Lumbar stenosis    Nocturia    OA  (osteoarthritis)    Prostate cancer [facility]) urologist--- dr eskridge/  onologist--- dr Kathrynn Running   dx 08/ 2021,  Stage T1c,  Gleason 4+4    Patient Active Problem List   Diagnosis Date Noted   Malignant neoplasm of prostate ([facility]) 11/14/2019   Idiopathic chronic gout of multiple sites with tophus 08/28/2019   Chronic pain disorder 02/07/2018   Lumbar degenerative disc disease 02/07/2018   Lumbar facet joint pain 02/07/2018   Spondylolisthesis of lumbar region 02/07/2018   Primary osteoarthritis of left knee 01/22/2018   Elevated PSA 11/02/2017   Mixed hyperlipidemia 10/27/2017   Portal hypertension ([facility]) 10/24/2017   CKD (chronic kidney disease) stage 3, GFR 30-59 ml/min ([facility]) 09/19/2017   Essential hypertension 08/21/2017   Hepatitis C virus infection cured after antiviral drug therapy 08/21/2017   Post-operative state 08/02/2013   Right inguinal hernia 06/14/2013    Past Surgical History:  Procedure Laterality Date   COLONOSCOPY WITH ESOPHAGOGASTRODUODENOSCOPY (EGD)  12-25-2017  dr Caryl Never   CYSTOSCOPY  02/14/2020   Procedure: Derinda Late;  Surgeon: Jerilee Field, MD;  Location: [facility] [place];  Service: Urology;;   ONE SEED FOUND IN BLADDER AND REMOVED   HERNIA REPAIR     INGUINAL HERNIA REPAIR Right 07/11/2013   Procedure: RIGHT HERNIA REPAIR  INGUINAL ADULT;  Surgeon: Clovis Pu. Cornett, MD;  Location: [facility];  Service: General;  Laterality: Right;   INSERTION OF MESH Right 07/11/2013   Procedure: INSERTION OF MESH;  Surgeon: Clovis Pu. Cornett, MD;  Location: [facility];  Service: General;  Laterality: Right;   RADIOACTIVE SEED IMPLANT N/A 02/14/2020   Procedure: RADIOACTIVE SEED IMPLANT/BRACHYTHERAPY IMPLANT;  Surgeon: Jerilee Field, MD;  Location: [facility] [place];  Service: Urology;  Laterality: N/A;    66 SEEDS IMPLANTED   SPACE OAR INSTILLATION N/A 02/14/2020   Procedure: SPACE OAR INSTILLATION;  Surgeon:  Jerilee Field, MD;  Location: [facility];  Service: Urology;  Laterality: N/A;       Home Medications    Prior to Admission medications   Medication Sig Start Date End Date Taking? Authorizing Provider  amLODipine (NORVASC) 10 MG tablet Take 10 mg by mouth daily. 10/27/17  Yes [provider]  FARXIGA 10 MG TABS tablet Take 10 mg by mouth daily. 06/20/22  Yes [provider]  atorvastatin (LIPITOR) 40 MG tablet Take 40 mg by mouth daily. 10/13/19   [provider]  colchicine 0.6 MG tablet Take 0.6 mg by mouth every other day. Patient not taking: Reported on 05/13/2020    [provider]  COVID-19 mRNA Vac-TriS, Pfizer, (PFIZER-BIONT COVID-19 VAC-TRIS) SUSP injection Inject into the muscle. 09/30/20   Judyann Munson, MD  diclofenac Sodium (VOLTAREN) 1 % GEL Apply 2 g topically 4 (four) times daily. 12/28/19   Couture, Cortni S, PA-C  doxycycline (VIBRAMYCIN) 100 MG capsule Take 1 capsule (100 mg total) by mouth 2 (two) times daily. 05/18/20   Rancour, Jeannett Senior, MD  febuxostat (ULORIC) 40 MG tablet Take 40 mg by mouth daily. 08/26/19   [provider]  glipiZIDE (GLUCOTROL) 5 MG tablet Take by mouth. 04/29/20   [provider]  ibuprofen (ADVIL) 600 MG tablet Take 1 tablet (600 mg total) by mouth every 6 (six) hours as needed. 03/17/22   Ellsworth Lennox, PA-C  oxyCODONE-acetaminophen (PERCOCET) 7.5-325 MG tablet Take 1 tablet by mouth every 6 (six) hours as needed for severe pain. 02/14/20   Jerilee Field, MD  predniSONE (DELTASONE) 10 MG tablet Take 1 tablet (10 mg total) by mouth daily. 03/17/22   Ellsworth Lennox, PA-C  tamsulosin (FLOMAX) 0.4 MG CAPS capsule Take by mouth. 04/29/20   [provider]    Family History Family History  Problem Relation Age of Onset   Breast cancer Sister    Prostate cancer Neg Hx    Colon cancer Neg Hx    Pancreatic cancer Neg Hx     Social History Social History   Tobacco Use    Smoking status: Never   Smokeless tobacco: Never  Vaping Use   Vaping status: Never Used  Substance Use Topics   Alcohol use: No   Drug use: No    Comment: hx iv drugs as youth     Allergies   Patient has no known allergies.   Review of Systems Review of Systems Per HPI  Physical Exam Triage Vital Signs ED Triage Vitals  Encounter Vitals Group     BP 10/14/22 1812 122/81     Systolic BP Percentile --      Diastolic BP Percentile --      Pulse Rate 10/14/22 1812 62     Resp 10/14/22 1812 18     Temp 10/14/22 1812 98.7 F (37.1 C)     Temp  Source 10/14/22 1812 Oral     SpO2 10/14/22 1812 97 %     Weight 10/14/22 1811 161 lb (73 kg)     Height 10/14/22 1811 5\' 8"  (1.727 m)     Head Circumference --      Peak Flow --      Pain Score 10/14/22 1809 0     Pain Loc --      Pain Education --      Exclude from Growth Chart --    No data found.  Updated Vital Signs BP 122/81 (BP Location: Left Arm)   Pulse 62   Temp 98.7 F (37.1 C) (Oral)   Resp 18   Ht 5\' 8"  (1.727 m)   Wt 161 lb (73 kg)   SpO2 97%   BMI 24.48 kg/m   Visual Acuity Right Eye Distance: 20/50 (Uncorrected) Left Eye Distance: 20/50 (Uncorrected.) Bilateral Distance: 20/25 (Uncorrected)  Right Eye Near:   Left Eye Near:    Bilateral Near:     Physical Exam Vitals and nursing note reviewed.  Constitutional:      Appearance: He is not ill-appearing or toxic-appearing.  HENT:     Head: Normocephalic and atraumatic.     Right Ear: Hearing and external ear normal.     Left Ear: Hearing and external ear normal.     Nose: Nose normal.     Mouth/Throat:     Lips: Pink.  Eyes:     General: Lids are normal. Vision grossly intact. Gaze aligned appropriately.     Extraocular Movements: Extraocular movements intact.     Conjunctiva/sclera:     Right eye: Right conjunctiva is not injected. No chemosis, exudate or hemorrhage.    Left eye: Left conjunctiva is not injected. Hemorrhage present. No  chemosis or exudate.    Comments: EOMs intact without pain or dizziness elicited.  Subconjunctival hemorrhage to the medial aspect of the left eye as seen in image below.  Pulmonary:     Effort: Pulmonary effort is normal.  Musculoskeletal:     Cervical back: Neck supple.  Skin:    General: Skin is warm and dry.     Capillary Refill: Capillary refill takes less than 2 seconds.     Findings: No rash.  Neurological:     General: No focal deficit present.     Mental Status: He is alert and oriented to person, place, and time. Mental status is at baseline.     Cranial Nerves: No dysarthria or facial asymmetry.  Psychiatric:        Mood and Affect: Mood normal.        Speech: Speech normal.        Behavior: Behavior normal.        Thought Content: Thought content normal.        Judgment: Judgment normal.      UC Treatments / Results  Labs (all labs ordered are listed, but only abnormal results are displayed) Labs Reviewed - No data to display  EKG   Radiology No results found.  Procedures Procedures (including critical care time)  Medications Ordered in UC Medications - No data to display  Initial Impression / Assessment and Plan / UC Course  I have reviewed the triage vital signs and the nursing notes.  Pertinent labs & imaging results that were available during my care of the patient were reviewed by me and considered in my medical decision making (see chart for details).   1.  Subconjunctival  hemorrhage of left eye Presentation consistent with above diagnosis.  This will likely resolve on its own in the next couple of weeks.  Recommend following up with ophthalmologist in the next 1 to 2 weeks if needed.  Return to urgent care if develop drainage, worsening redness/swelling, pain, itch, or any other new or worsening symptoms.  Vision is grossly intact.  No indication for referral to the emergency department for further workup or evaluation.  Counseled patient on  potential for adverse effects with medications prescribed/recommended today, strict ER and return-to-clinic precautions discussed, patient verbalized understanding.    Final Clinical Impressions(s) / UC Diagnoses   Final diagnoses:  Subconjunctival hemorrhage of left eye     Discharge Instructions      You have a subconjunctival hemorrhage of your left eye.  This should resolve on its own in the next couple of weeks.  If you start developing any vision changes to the eye, swelling, drainage, or any other new symptoms, please return. Otherwise schedule an appointment with your eye doctor for follow-up in the next 1 to 2 weeks.      ED Prescriptions   None    PDMP not reviewed this encounter.   Carlisle Beers, [place] 10/14/22 1920
